# Patient Record
Sex: Female | Born: 1975 | Race: White | Hispanic: No | Marital: Single | Smoking: Never smoker
Health system: Southern US, Community
[De-identification: ages and names within clinical notes are randomized; demographics above are authoritative.]

## PROBLEM LIST (undated history)

## (undated) DIAGNOSIS — Z833 Family history of diabetes mellitus: Secondary | ICD-10-CM

## (undated) DIAGNOSIS — M419 Scoliosis, unspecified: Secondary | ICD-10-CM

## (undated) DIAGNOSIS — E119 Type 2 diabetes mellitus without complications: Secondary | ICD-10-CM

## (undated) HISTORY — PX: CERVICAL DISC SURGERY: SHX588

---

## 2002-02-01 ENCOUNTER — Encounter: Payer: Self-pay | Admitting: Neurosurgery

## 2002-02-01 ENCOUNTER — Ambulatory Visit (HOSPITAL_COMMUNITY): Admission: RE | Admit: 2002-02-01 | Discharge: 2002-02-02 | Payer: Self-pay | Admitting: Neurosurgery

## 2013-01-13 ENCOUNTER — Encounter (HOSPITAL_BASED_OUTPATIENT_CLINIC_OR_DEPARTMENT_OTHER): Payer: Self-pay

## 2013-01-13 ENCOUNTER — Emergency Department (HOSPITAL_BASED_OUTPATIENT_CLINIC_OR_DEPARTMENT_OTHER)
Admission: EM | Admit: 2013-01-13 | Discharge: 2013-01-13 | Disposition: A | Discharge status: Home / Self Care | Payer: BC Managed Care – PPO | Attending: Emergency Medicine | Admitting: Emergency Medicine

## 2013-01-13 ENCOUNTER — Emergency Department (HOSPITAL_BASED_OUTPATIENT_CLINIC_OR_DEPARTMENT_OTHER): Payer: BC Managed Care – PPO

## 2013-01-13 DIAGNOSIS — S335XXA Sprain of ligaments of lumbar spine, initial encounter: Secondary | ICD-10-CM | POA: Insufficient documentation

## 2013-01-13 DIAGNOSIS — Y9289 Other specified places as the place of occurrence of the external cause: Secondary | ICD-10-CM | POA: Insufficient documentation

## 2013-01-13 DIAGNOSIS — Y9389 Activity, other specified: Secondary | ICD-10-CM | POA: Insufficient documentation

## 2013-01-13 DIAGNOSIS — Z8739 Personal history of other diseases of the musculoskeletal system and connective tissue: Secondary | ICD-10-CM | POA: Insufficient documentation

## 2013-01-13 DIAGNOSIS — S39012A Strain of muscle, fascia and tendon of lower back, initial encounter: Secondary | ICD-10-CM

## 2013-01-13 DIAGNOSIS — R296 Repeated falls: Secondary | ICD-10-CM | POA: Insufficient documentation

## 2013-01-13 HISTORY — DX: Family history of diabetes mellitus: Z83.3

## 2013-01-13 HISTORY — DX: Scoliosis, unspecified: M41.9

## 2013-01-13 MED ORDER — KETOROLAC TROMETHAMINE 60 MG/2ML IM SOLN
60.0000 mg | Freq: Once | INTRAMUSCULAR | Status: AC
  Administered 2013-01-13: 60 mg via INTRAMUSCULAR
  Filled 2013-01-13: qty 2

## 2013-01-13 MED ORDER — DIAZEPAM 5 MG/ML IJ SOLN
5.0000 mg | Freq: Once | INTRAMUSCULAR | Status: AC
  Administered 2013-01-13: 5 mg via INTRAMUSCULAR
  Filled 2013-01-13: qty 2

## 2013-01-13 MED ORDER — KETOROLAC TROMETHAMINE 10 MG PO TABS: 10.0000 mg | ORAL_TABLET | Freq: Four times a day (QID) | ORAL | Status: DC | PRN

## 2013-01-13 MED ORDER — DIAZEPAM 5 MG PO TABS: 5.0000 mg | ORAL_TABLET | Freq: Once | ORAL | Status: DC

## 2013-01-13 MED ORDER — DIAZEPAM 5 MG PO TABS: 5.0000 mg | ORAL_TABLET | Freq: Three times a day (TID) | ORAL | Status: DC | PRN

## 2013-01-13 MED ORDER — IBUPROFEN 800 MG PO TABS: 800.0000 mg | ORAL_TABLET | Freq: Once | ORAL | Status: DC

## 2013-01-13 NOTE — ED Notes (Signed)
Patient transported to X-ray 

## 2013-01-13 NOTE — ED Notes (Signed)
Pt states CBG was 234 this am and she has a humalog insulin pump.

## 2013-01-13 NOTE — ED Notes (Signed)
Pt reports she was getting dressed in the locker room yesterday, felt her "legs buckle" and she fell.  She now reports low back pain unrelieved after taking Flexeril.

## 2013-01-13 NOTE — ED Notes (Signed)
EDP Bernette Mayers advised to given RTW 2/10 note

## 2013-01-13 NOTE — ED Provider Notes (Signed)
History     CSN: [id number]  Arrival date & time 01/13/13  0924   First MD Initiated Contact with Patient 01/13/13 1109      Chief Complaint  Patient presents with  . Back Pain    (Consider location/radiation/quality/duration/timing/severity/associated sxs/prior treatment) HPI Pt reports yesterday after work her legs gave out and she fell down. She reports she has had severe aching lower back pain since that time, worse with movement, not radiating down her leg. Not associated with incontinence or leg weakness. She has taken Flexeril at home with minimal improvement.   Past Medical History  Diagnosis Date  . Lumbar scoliosis   . Thoracic scoliosis   . FH: juvenile onset diabetes mellitus     Past Surgical History  Procedure Date  . Cervical disc surgery   . Cesarean section     No family history on file.  History  Substance Use Topics  . Smoking status: Never Smoker   . Smokeless tobacco: Not on file  . Alcohol Use: Yes     Comment: occasional    OB History    Grav Para Term Preterm Abortions TAB SAB Ect Mult Living                  Review of Systems All other systems reviewed and are negative except as noted in HPI.   Allergies  Sulfa antibiotics  Home Medications   Current Outpatient Rx  Name  Route  Sig  Dispense  Refill  . CYCLOBENZAPRINE HCL 5 MG PO TABS   Oral   Take 5 mg by mouth 3 (three) times daily as needed.           BP 140/88  Pulse 92  Temp 98.1 F (36.7 C) (Oral)  Resp 18  Ht 5\' 10"  (1.778 m)  Wt 175 lb (79.379 kg)  BMI 25.11 kg/m2  SpO2 100%  LMP 01/06/2013  Physical Exam  Nursing note and vitals reviewed. Constitutional: She is oriented to person, place, and time. She appears well-developed and well-nourished.  HENT:  Head: Normocephalic and atraumatic.  Eyes: EOM are normal. Pupils are equal, round, and reactive to light.  Neck: Normal range of motion. Neck supple.  Cardiovascular: Normal rate, normal heart sounds and  intact distal pulses.   Pulmonary/Chest: Effort normal and breath sounds normal.  Abdominal: Bowel sounds are normal. She exhibits no distension. There is no tenderness.  Musculoskeletal: Normal range of motion. She exhibits tenderness (soft tissue lumbar tenderness). She exhibits no edema.  Neurological: She is alert and oriented to person, place, and time. She has normal strength. She displays normal reflexes. No cranial nerve deficit or sensory deficit.  Skin: Skin is warm and dry. No rash noted.  Psychiatric: She has a normal mood and affect.    ED Course  Procedures (including critical care time)  Labs Reviewed - No data to display Dg Lumbar Spine Complete  01/13/2013  *RADIOLOGY REPORT*  Clinical Data: Low back pain with bilateral leg pain and weakness.  LUMBAR SPINE - COMPLETE 4+ VIEW  Comparison: None.  Findings: There is transitional lumbosacral anatomy.  It is assumed that there are small ribs at T12 and five lumbar type vertebral bodies.  There is a convex left scoliosis centered at L1.  The lateral alignment is normal.  There is mild disc space loss at L3- L4.  There is no evidence of acute fracture or pars defect. Intrauterine device is noted.  IMPRESSION: Scoliosis with possible mild disc space  loss at L3-L4.  No acute osseous findings.   Original Report Authenticated By: Carey Bullocks, M.D.      No diagnosis found.    MDM  Xray ordered in triage is neg. Pt given IM toradol and valium with good improvement. Doubt this is disc injury, but advised to follow up with PCP if symptoms persist.         Leonette Most B. Bernette Mayers, MD 01/13/13 [phone]

## 2014-10-04 IMAGING — CR DG LUMBAR SPINE COMPLETE 4+V
5 series · 5 of 5 positions shown · non-contrast
Comparison: None.

CLINICAL DATA: Low back pain with bilateral leg pain and weakness.

LUMBAR SPINE - COMPLETE 4+ VIEW

[t l-spine a.p.]
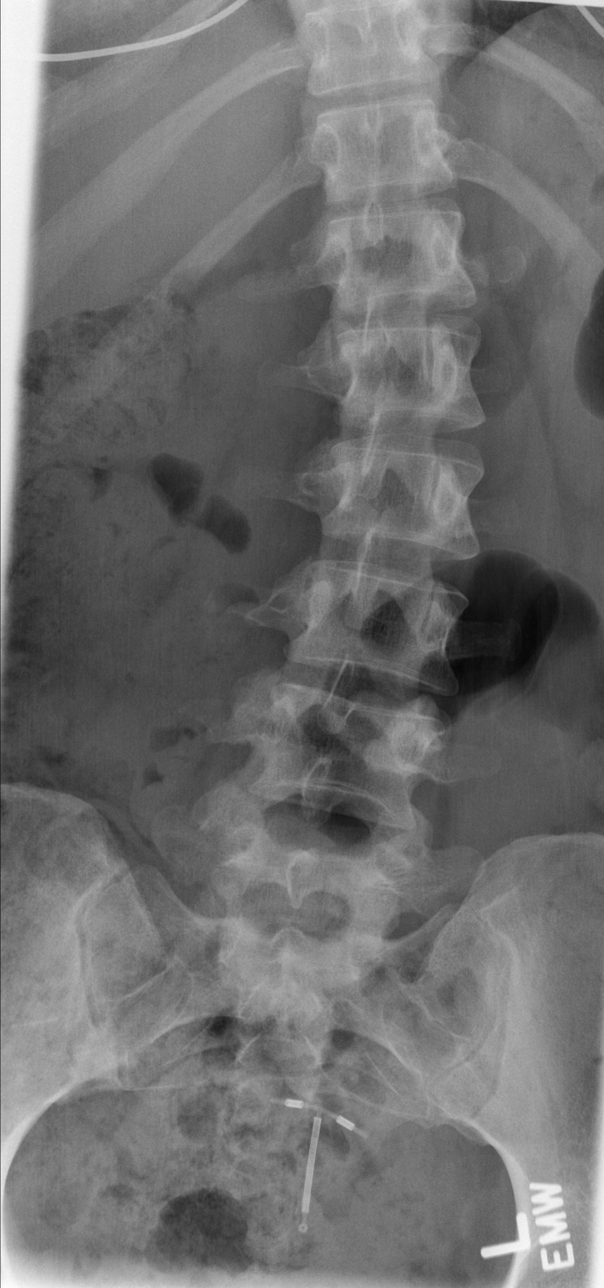

[t l-spine oblique exposure (1 of 2)]
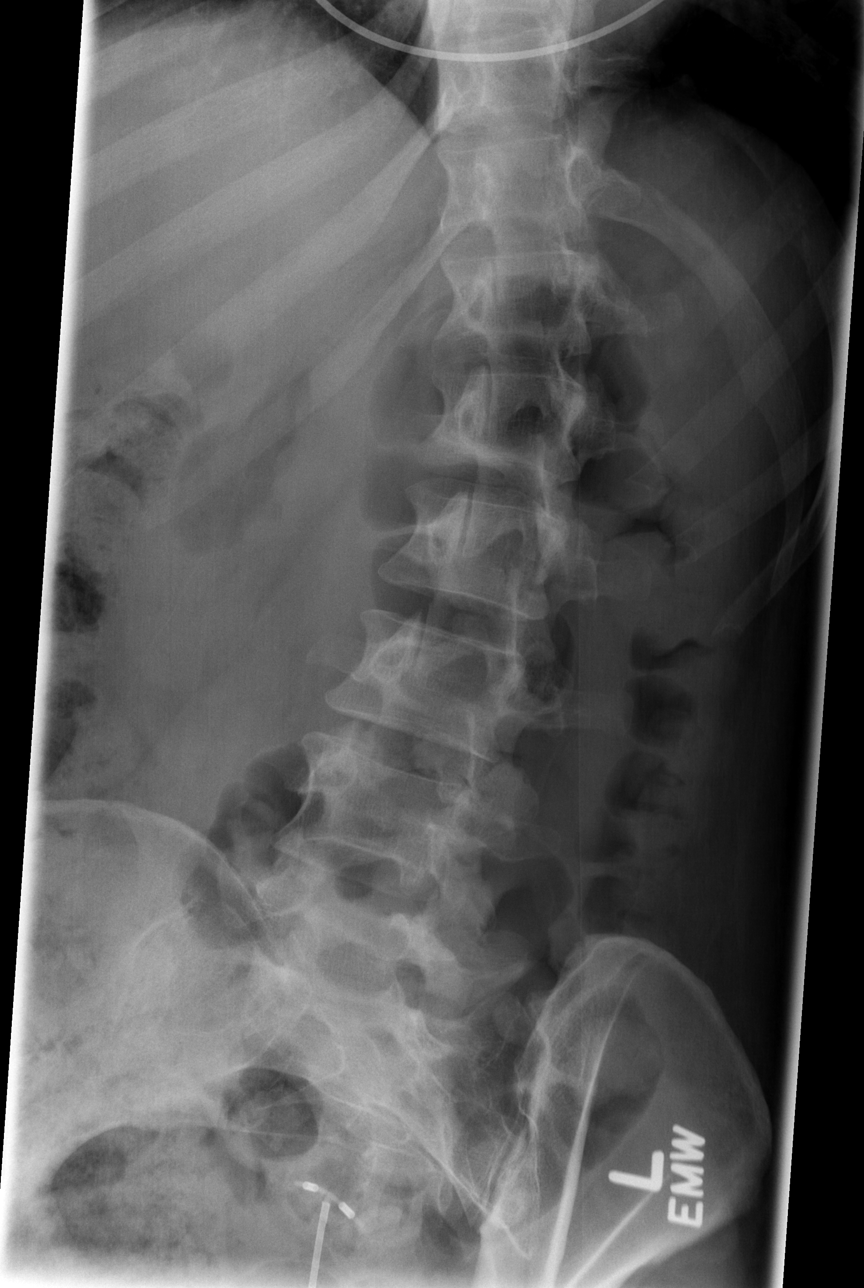

[t l-spine oblique exposure (2 of 2)]
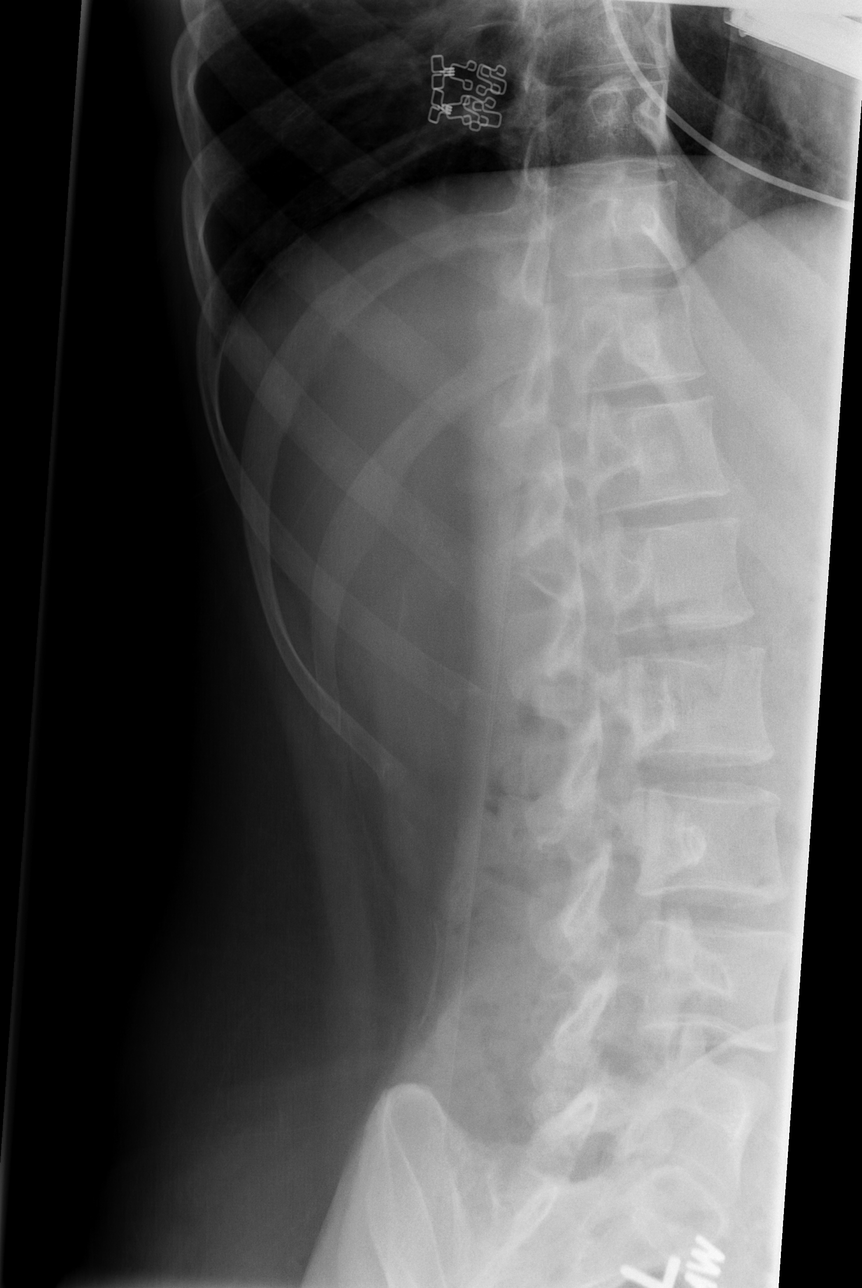

[t l-spine lat]
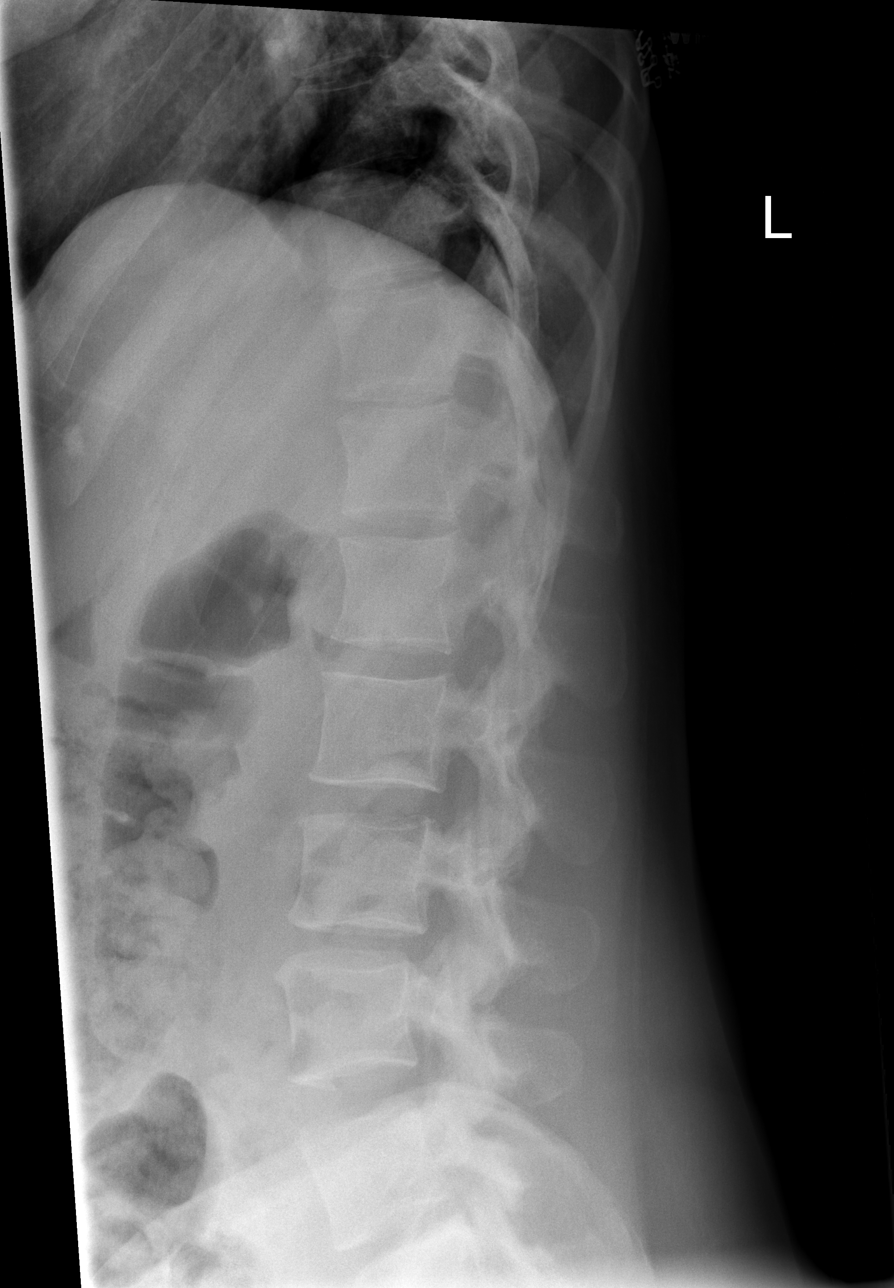

[t l-spine l5-s1 spot]
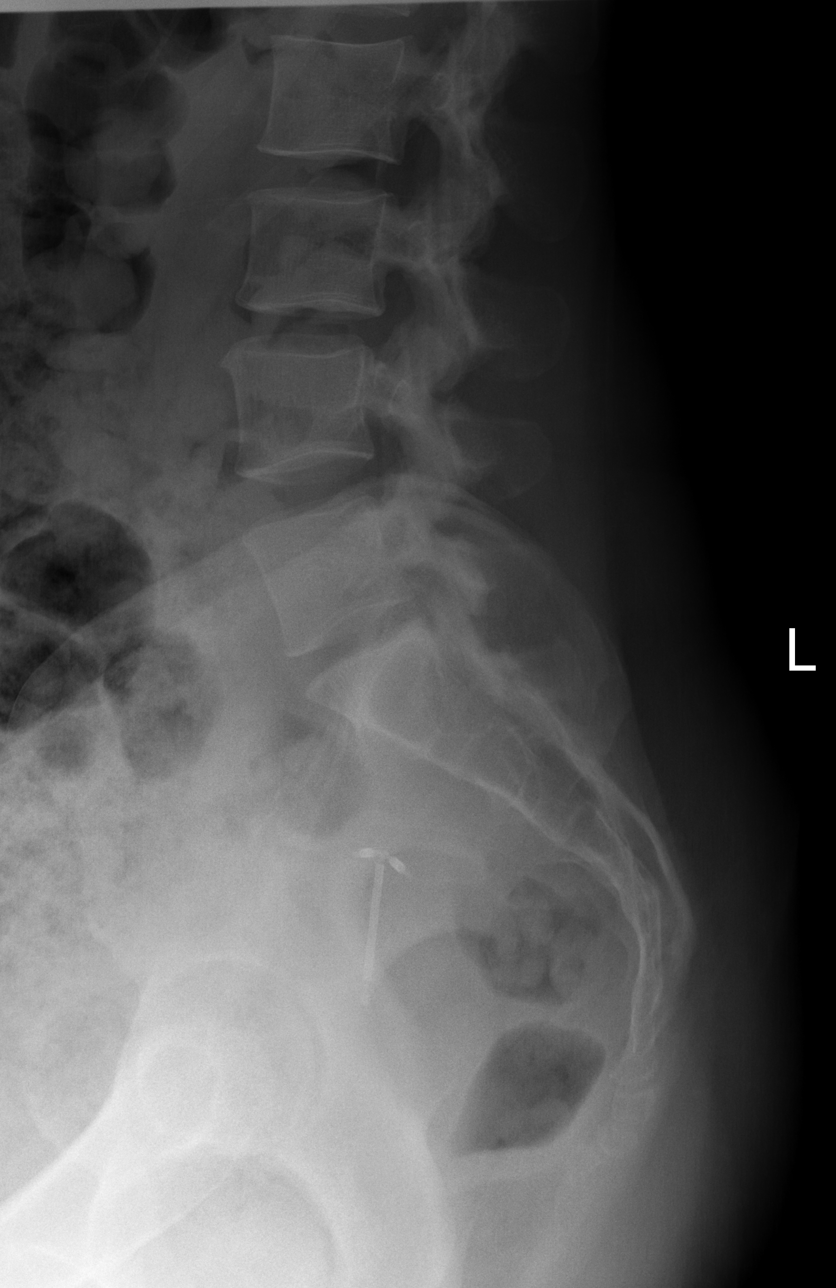

[5 of 5 positions shown; findings below may reference images not displayed]

FINDINGS: There is transitional lumbosacral anatomy.  It is assumed
that there are small ribs at T12 and five lumbar type vertebral
bodies.  There is a convex left scoliosis centered at L1.  The
lateral alignment is normal.  There is mild disc space loss at L3-
L4.  There is no evidence of acute fracture or pars defect.
Intrauterine device is noted.
IMPRESSION: Scoliosis with possible mild disc space loss at L3-L4.  No acute
osseous findings.

## 2017-01-26 ENCOUNTER — Emergency Department (HOSPITAL_COMMUNITY)
Admission: EM | Admit: 2017-01-26 | Discharge: 2017-01-26 | Disposition: A | Discharge status: Home / Self Care | Payer: BLUE CROSS/BLUE SHIELD | Attending: Emergency Medicine | Admitting: Emergency Medicine

## 2017-01-26 ENCOUNTER — Encounter (HOSPITAL_COMMUNITY): Payer: Self-pay | Admitting: Emergency Medicine

## 2017-01-26 DIAGNOSIS — R112 Nausea with vomiting, unspecified: Secondary | ICD-10-CM | POA: Diagnosis present

## 2017-01-26 DIAGNOSIS — R197 Diarrhea, unspecified: Secondary | ICD-10-CM | POA: Diagnosis not present

## 2017-01-26 DIAGNOSIS — E109 Type 1 diabetes mellitus without complications: Secondary | ICD-10-CM | POA: Insufficient documentation

## 2017-01-26 HISTORY — DX: Type 2 diabetes mellitus without complications: E11.9

## 2017-01-26 LAB — URINALYSIS, ROUTINE W REFLEX MICROSCOPIC
Bilirubin Urine: NEGATIVE
GLUCOSE, UA: NEGATIVE mg/dL
HGB URINE DIPSTICK: NEGATIVE
Ketones, ur: 80 mg/dL — AB
Leukocytes, UA: NEGATIVE
Nitrite: NEGATIVE
Protein, ur: NEGATIVE mg/dL
SPECIFIC GRAVITY, URINE: 1.017 (ref 1.005–1.030)
pH: 8 (ref 5.0–8.0)

## 2017-01-26 LAB — COMPREHENSIVE METABOLIC PANEL
ALK PHOS: 37 U/L — AB (ref 38–126)
ALT: 23 U/L (ref 14–54)
AST: 31 U/L (ref 15–41)
Albumin: 4 g/dL (ref 3.5–5.0)
Anion gap: 13 (ref 5–15)
BUN: 14 mg/dL (ref 6–20)
CALCIUM: 8.5 mg/dL — AB (ref 8.9–10.3)
CO2: 19 mmol/L — AB (ref 22–32)
CREATININE: 0.81 mg/dL (ref 0.44–1.00)
Chloride: 105 mmol/L (ref 101–111)
GFR calc non Af Amer: >60 mL/min (ref >60)
Glucose, Bld: 115 mg/dL — ABNORMAL HIGH (ref 65–99)
Potassium: 3.5 mmol/L (ref 3.5–5.1)
SODIUM: 137 mmol/L (ref 135–145)
Total Bilirubin: 0.9 mg/dL (ref 0.3–1.2)
Total Protein: 6.7 g/dL (ref 6.5–8.1)

## 2017-01-26 LAB — CBG MONITORING, ED: GLUCOSE-CAPILLARY: 103 mg/dL — AB (ref 65–99)

## 2017-01-26 LAB — CBC
HCT: 40.3 % (ref 36.0–46.0)
Hemoglobin: 13.9 g/dL (ref 12.0–15.0)
MCH: 30.7 pg (ref 26.0–34.0)
MCHC: 34.5 g/dL (ref 30.0–36.0)
MCV: 89 fL (ref 78.0–100.0)
PLATELETS: 199 10*3/uL (ref 150–400)
RBC: 4.53 MIL/uL (ref 3.87–5.11)
RDW: 13.5 % (ref 11.5–15.5)
WBC: 13.8 10*3/uL — ABNORMAL HIGH (ref 4.0–10.5)

## 2017-01-26 LAB — LIPASE, BLOOD: Lipase: 26 U/L (ref 11–51)

## 2017-01-26 LAB — I-STAT BETA HCG BLOOD, ED (MC, WL, AP ONLY): I-stat hCG, quantitative: <5 m[IU]/mL (ref <5)

## 2017-01-26 MED ORDER — METOCLOPRAMIDE HCL 10 MG PO TABS: 10.0000 mg | ORAL_TABLET | Freq: Four times a day (QID) | ORAL | 0 refills | Status: DC | PRN

## 2017-01-26 MED ORDER — PROMETHAZINE HCL 25 MG/ML IJ SOLN
12.5000 mg | Freq: Once | INTRAMUSCULAR | Status: AC
  Administered 2017-01-26: 12.5 mg via INTRAVENOUS
  Filled 2017-01-26: qty 1

## 2017-01-26 MED ORDER — METOCLOPRAMIDE HCL 5 MG/ML IJ SOLN
10.0000 mg | Freq: Once | INTRAMUSCULAR | Status: AC
  Administered 2017-01-26: 10 mg via INTRAVENOUS
  Filled 2017-01-26: qty 2

## 2017-01-26 MED ORDER — SODIUM CHLORIDE 0.9 % IV BOLUS (SEPSIS)
2000.0000 mL | Freq: Once | INTRAVENOUS | Status: AC
  Administered 2017-01-26: 2000 mL via INTRAVENOUS

## 2017-01-26 NOTE — Discharge Instructions (Signed)
You have been seen in the Emergency Department (ED) today for nausea and vomiting.  Your work up today has not shown a clear cause for your symptoms. °You have been prescribed Reglan; please use as prescribed as needed for your nausea. ° °Follow up with your doctor as soon as possible regarding today?s emergent visit and your symptoms of nausea.  ° °Return to the ED if you develop abdominal, bloody vomiting, bloody diarrhea, if you are unable to tolerate fluids due to vomiting, or if you develop other symptoms that concern you. ° °

## 2017-01-26 NOTE — ED Triage Notes (Signed)
BIB EMS from home, sudden onset N/V at 3p, pt also had syncope approx 45 min ago. Chills, fever, abd pain. Given 700NS, 8mg  zofran en route.

## 2017-01-26 NOTE — ED Provider Notes (Signed)
Emergency Department Provider Note   I have reviewed the triage vital signs and the nursing notes.   HISTORY  Chief Complaint Emesis and Loss of Consciousness   HPI Susan Watkins is a 41 y.o. female with PMH of Type I DM presents to the emergency department for evaluation of sudden onset nausea, multiple episodes of vomiting, one episode of diarrhea. Patient has had symptoms throughout the afternoon and is gradually getting worse. She called EMS and was given Zofran 8 mg in route with minimal relief and nausea. No prior symptoms of similar. She states her blood sugars have been well-controlled over the past week. No fevers, chills, chest pain or difficulty breathing. She states that she's been tested for gastroparesis and does not have this diagnosis.   Past Medical History:  Diagnosis Date  . Diabetes mellitus without complication (HCC)   . FH: juvenile onset diabetes mellitus   . Lumbar scoliosis   . Thoracic scoliosis     There are no active problems to display for this patient.   Past Surgical History:  Procedure Laterality Date  . CERVICAL DISC SURGERY    . CESAREAN SECTION      Current Outpatient Rx  . Order #: [id number] Class: Historical Med  . Order #: [id number] Class: Print  . Order #: [id number] Class: Print  . Order #: [id number] Class: Print    Allergies Sulfa antibiotics  No family history on file.  Social History Social History  Substance Use Topics  . Smoking status: Never Smoker  . Smokeless tobacco: Not on file  . Alcohol use Yes     Comment: occasional    Review of Systems  10-point ROS otherwise negative.  ____________________________________________   PHYSICAL EXAM:  VITAL SIGNS: ED Triage Vitals  Enc Vitals Group     BP 01/26/17 1931 119/68     Pulse Rate 01/26/17 1931 99     Resp 01/26/17 1931 (!) 34     Temp 01/26/17 1931 99.3 F (37.4 C)     Temp Source 01/26/17 1931 Oral     SpO2 01/26/17 1925 97 %     Weight 01/26/17 1928 166  lb (75.3 kg)     Height 01/26/17 1928 5\' 10"  (1.778 m)     Pain Score 01/26/17 1928 5   Constitutional: Alert and oriented. Appears very uncomfortable with frequent dry heaving.  Eyes: Conjunctivae are normal.  Head: Atraumatic. Nose: No congestion/rhinnorhea. Mouth/Throat: Mucous membranes are dry. Oropharynx non-erythematous. Neck: No stridor.  Cardiovascular: Normal rate, regular rhythm. Good peripheral circulation. Grossly normal heart sounds.   Respiratory: Normal respiratory effort.  No retractions. Lungs CTAB. Gastrointestinal: Soft and nontender. No distention.  Musculoskeletal: No lower extremity tenderness nor edema. No gross deformities of extremities. Neurologic:  Normal speech and language. No gross focal neurologic deficits are appreciated.  Skin:  Skin is warm, dry and intact. No rash noted. Psychiatric: Mood and affect are normal. Speech and behavior are normal.  ____________________________________________   LABS (all labs ordered are listed, but only abnormal results are displayed)  Labs Reviewed  CBC - Abnormal; Notable for the following:       Result Value   WBC 13.8 (*)    All other components within normal limits  URINALYSIS, ROUTINE W REFLEX MICROSCOPIC - Abnormal; Notable for the following:    Ketones, ur 80 (*)    All other components within normal limits  COMPREHENSIVE METABOLIC PANEL - Abnormal; Notable for the following:    CO2 19 (*)    Glucose,  Bld 115 (*)    Calcium 8.5 (*)    Alkaline Phosphatase 37 (*)    All other components within normal limits  CBG MONITORING, ED - Abnormal; Notable for the following:    Glucose-Capillary 103 (*)    All other components within normal limits  LIPASE, BLOOD  I-STAT BETA HCG BLOOD, ED ([facility], [facility], [facility] ONLY)    EKG:   EKG Interpretation  Date/Time:  Sunday January 26 2017 20:35:29 EST Ventricular Rate:  80 PR Interval:    QRS Duration: 97 QT Interval:  411 QTC Calculation: 475 R Axis:   82 Text  Interpretation:  Sinus rhythm No STEMI.  Confirmed by ZAMMIT  MD, Jomarie LongsJOSEPH [phone]([id number]) on 01/27/2017 3:25:58 PM       ____________________________________________  RADIOLOGY  None ____________________________________________   PROCEDURES  Procedure(s) performed:   Procedures  None ____________________________________________   INITIAL IMPRESSION / ASSESSMENT AND PLAN / ED COURSE  Pertinent labs & imaging results that were available during my care of the patient were reviewed by me and considered in my medical decision making (see chart for details).  Patient with severe nausea and vomiting. Not complaining of significant associated abdominal pain. Abdomen is soft and non-tender. Will obtain labs to r/o DKA and provide IVF along with nausea control.   08:40 PM Nausea improved but patient reporting some continued discomfort. Repeat EKG with QTc of 475. Will give Reglan. Leukocytosis with CMP pending.   09:52 PM Patient with much improved symptoms. Giving additional IVF and PO challenge. No abdominal pain. No residual nausea.   10:45 PM Patient tolerating PO. Continues to feel well. Will discharge home at this time.   At this time, I do not feel there is any life-threatening condition present. I have reviewed and discussed all results (EKG, imaging, lab, urine as appropriate), exam findings with patient. I have reviewed nursing notes and appropriate previous records.  I feel the patient is safe to be discharged home without further emergent workup. Discussed usual and customary return precautions. Patient and family (if present) verbalize understanding and are comfortable with this plan.  Patient will follow-up with their primary care provider. If they do not have a primary care provider, information for follow-up has been provided to them. All questions have been answered.  ____________________________________________  FINAL CLINICAL IMPRESSION(S) / ED DIAGNOSES  Final diagnoses:    Nausea vomiting and diarrhea     MEDICATIONS GIVEN DURING THIS VISIT:  Medications  promethazine (PHENERGAN) injection 12.5 mg (12.5 mg Intravenous Given 01/26/17 1949)  metoCLOPramide (REGLAN) injection 10 mg (10 mg Intravenous Given 01/26/17 2048)  sodium chloride 0.9 % bolus 2,000 mL (0 mLs Intravenous Stopped 01/26/17 2320)     NEW OUTPATIENT MEDICATIONS STARTED DURING THIS VISIT:  Discharge Medication List as of 01/26/2017 10:55 PM    START taking these medications   Details  metoCLOPramide (REGLAN) 10 MG tablet Take 1 tablet (10 mg total) by mouth every 6 (six) hours as needed for nausea or vomiting., Starting Sun 01/26/2017, Print          Note:  This document was prepared using Dragon voice recognition software and may include unintentional dictation errors.  Alona BeneJoshua Adama Ferber, MD Emergency Medicine   Maia PlanJoshua G Neyda Durango, MD 01/28/17 [phone]

## 2017-01-26 NOTE — ED Notes (Signed)
Pt passed PO challenge.

## 2017-01-26 NOTE — ED Notes (Signed)
Repeat EKG obtained and given to Long MD. Pt was ambulatory to restroom with steady gait.

## 2018-08-21 ENCOUNTER — Ambulatory Visit: Payer: BLUE CROSS/BLUE SHIELD | Admitting: Endocrinology

## 2018-08-21 ENCOUNTER — Encounter: Payer: Self-pay | Admitting: Endocrinology

## 2018-08-21 DIAGNOSIS — E119 Type 2 diabetes mellitus without complications: Secondary | ICD-10-CM | POA: Insufficient documentation

## 2018-08-21 DIAGNOSIS — E109 Type 1 diabetes mellitus without complications: Secondary | ICD-10-CM

## 2018-08-21 LAB — POCT GLYCOSYLATED HEMOGLOBIN (HGB A1C): Hemoglobin A1C: 6.7 % — AB (ref 4.0–5.6)

## 2018-08-21 MED ORDER — PARADIGM PUMP RESERVOIR 3ML MISC: 1.0000 | 3 refills | Status: DC

## 2018-08-21 MED ORDER — "PARADIGM SILHOUETTE FULL 43"" MISC": 1.0000 | 3 refills | Status: DC

## 2018-08-21 NOTE — Patient Instructions (Addendum)
good diet and exercise significantly improve the control of your diabetes.  please let me know if you wish to be referred to a dietician.  high blood sugar is very risky to your health.  you should see an eye doctor and dentist every year.  It is very important to get all recommended vaccinations.  Controlling your blood pressure and cholesterol drastically reduces the damage diabetes does to your body.  Those who smoke should quit.  Please discuss these with your doctor.  check your blood sugar 10 times a day: before the 3 meals, and at bedtime.  also check if you have symptoms of your blood sugar being too high or too low.  please keep a record of the readings and bring it to your next appointment here (or you can bring the meter itself).  You can write it on any piece of paper.  please call us sooner if your blood sugar goes below 70, or if you have a lot of readings over 200. Please take these settings: basal rates: MN 0.95 units/hr 04:30 0.9 11:00 0.85 13:00 0.875 13:30 0.825 16:30 0.85 17:00 1.2 bolus of 1 unit/14 grams carbohydrate continue correction bolus (which some people call "sensitivity," or "insulin sensitivity ratio," or just "isr") of 1 unit for each 80 by which your glucose exceeds 90. Please come back for a follow-up appointment in 3 months.

## 2018-08-21 NOTE — Progress Notes (Signed)
Subjective:    Patient ID: Susan Watkins, female    DOB: 10-03-1976, 42 y.o.   MRN: [id number]  HPI pt is referred by Bradly Bienenstock, NP, for diabetes.  Pt states DM was dx'ed in 1987; she has mild if any neuropathy of the lower extremities; she is unaware of any associated chronic complications; she has been on insulin since dx; pt says her diet and exercise are excellent; she has never had pancreatitis or pancreatic surgery.  Last episode of severe hypoglycemia was in 2009.  She has had DKA only once (at dx).  She has been on pump rx since 1996, and medtronic 670G, with continuous glucose monitor since 2018.  She is not at risk for pregnancy.  She does not use auto-mode--she says manual adjustments work better.  I reviewed continuous glucose monitor data.  Glucose varies from 40-280, but most are in the 100's.  It is in general higher after meals, and lowest fasting. She takes these pump settings: Basal settings (~ 22.488 units) 0:00 1.00 2:00 0.85 4:30 1.10 13:30 0.725 16:30 0.825 20:00 0.850 22:00 0.875 Carb ratio 0:00 8 10:00 10 15:00 8 ISF  0:00 55 Target 100 Past Medical History:  Diagnosis Date  . Diabetes mellitus without complication ([place])   . FH: juvenile onset diabetes mellitus   . Lumbar scoliosis   . Thoracic scoliosis     Past Surgical History:  Procedure Laterality Date  . CERVICAL DISC SURGERY    . CESAREAN SECTION      Social History   Socioeconomic History  . Marital status: Single    Spouse name: Not on file  . Number of children: Not on file  . Years of education: Not on file  . Highest education level: Not on file  Occupational History  . Not on file  Social Needs  . Financial resource strain: Not on file  . Food insecurity:    Worry: Not on file    Inability: Not on file  . Transportation needs:    Medical: Not on file    Non-medical: Not on file  Tobacco Use  . Smoking status: Never Smoker  . Smokeless tobacco: Never Used  Substance  and Sexual Activity  . Alcohol use: Yes    Comment: occasional  . Drug use: No  . Sexual activity: Not on file  Lifestyle  . Physical activity:    Days per week: Not on file    Minutes per session: Not on file  . Stress: Not on file  Relationships  . Social connections:    Talks on phone: Not on file    Gets together: Not on file    Attends religious service: Not on file    Active member of club or organization: Not on file    Attends meetings of clubs or organizations: Not on file    Relationship status: Not on file  . Intimate partner violence:    Fear of current or ex partner: Not on file    Emotionally abused: Not on file    Physically abused: Not on file    Forced sexual activity: Not on file  Other Topics Concern  . Not on file  Social History Narrative  . Not on file    Current Outpatient Medications on File Prior to Visit  Medication Sig Dispense Refill  . furosemide (LASIX) 20 MG tablet furosemide 20 mg tablet    . glucagon 1 MG injection Glucagon Emergency Kit (human-recomb) 1 mg solution for injection    .  Insulin Glargine (LANTUS SOLOSTAR) 100 UNIT/ML Solostar Pen Lantus Solostar U-100 Insulin 100 unit/mL (3 mL) subcutaneous pen    . insulin lispro (HUMALOG) 100 UNIT/ML injection Humalog U-100 Insulin 100 unit/mL subcutaneous solution    . lisinopril (PRINIVIL,ZESTRIL) 10 MG tablet lisinopril 10 mg tablet    . methocarbamol (ROBAXIN) 500 MG tablet Take by mouth.     No current facility-administered medications on file prior to visit.     Allergies  Allergen Reactions  . Levofloxacin Other (See Comments)    Severe Tendonitis    . Sulfa Antibiotics Rash    Family History  Problem Relation Age of Onset  . Diabetes Neg Hx     BP 116/78 (BP Location: Right Arm)   Pulse 67   Ht _0  (1.778 m)   Wt 184 lb 3.2 oz (83.6 kg)   SpO2 98%   BMI 26.43 kg/m   Review of Systems denies weight loss, blurry vision, headache, chest pain, sob, n/v, urinary  frequency, muscle cramps, excessive diaphoresis, depression, rhinorrhea, and easy bruising.  She has cold intolerance.      Objective:   Physical Exam VS: see vs page GEN: no distress HEAD: head: no deformity eyes: no periorbital swelling, no proptosis external nose and ears are normal mouth: no lesion seen NECK: supple, thyroid is not enlarged CHEST WALL: no deformity LUNGS: clear to auscultation CV: reg rate and rhythm, no murmur ABD: abdomen is soft, nontender.  no hepatosplenomegaly.  not distended.  no hernia MUSCULOSKELETAL: muscle bulk and strength are grossly normal.  no obvious joint swelling.  gait is normal and steady EXTEMITIES: no deformity.  no ulcer on the feet.  feet are of normal color and temp.  no edema PULSES: dorsalis pedis intact bilat.  no carotid bruit NEURO:  cn 2-12 grossly intact.   readily moves all 4's.  sensation is intact to touch on the feet SKIN:  Normal texture and temperature.  No rash or suspicious lesion is visible.   NODES:  None palpable at the neck PSYCH: alert, well-oriented.  Does not appear anxious nor depressed.  Lab Results  Component Value Date   HGBA1C 6.7 (A) 08/21/2018   I have reviewed outside records, and summarized: Pt was noted to have elevated DM, and referred here.  Other prob addressed were HTN and edema.      Assessment & Plan:  Type 1 DM, new to me.  Based on the pattern of her cbg's, she needs some adjustment in her pump settings.  Patient Instructions  good diet and exercise significantly improve the control of your diabetes.  please let me know if you wish to be referred to a dietician.  high blood sugar is very risky to your health.  you should see an eye doctor and dentist every year.  It is very important to get all recommended vaccinations.  Controlling your blood pressure and cholesterol drastically reduces the damage diabetes does to your body.  Those who smoke should quit.  Please discuss these with your doctor.    check your blood sugar 10 times a day: before the 3 meals, and at bedtime.  also check if you have symptoms of your blood sugar being too high or too low.  please keep a record of the readings and bring it to your next appointment here (or you can bring the meter itself).  You can write it on any piece of paper.  please call us sooner if your blood sugar goes below 70,  or if you have a lot of readings over 200. Please take these settings: basal rates: MN 0.95 units/hr 04:30 0.9 11:00 0.85 13:00 0.875 13:30 0.825 16:30 0.85 17:00 1.2 bolus of 1 unit/14 grams carbohydrate continue correction bolus (which some people call "sensitivity," or "insulin sensitivity ratio," or just "isr") of 1 unit for each 80 by which your glucose exceeds 90. Please come back for a follow-up appointment in 3 months.

## 2018-09-03 ENCOUNTER — Telehealth: Payer: Self-pay | Admitting: Dietician

## 2018-09-03 NOTE — Telephone Encounter (Signed)
Brief Nutrition Note Patient called to set up an appointment for weight loss.  History includes diabetes. Appointment arranged for October 28th at 3:30.  Oran Rein, RD, LDN, CDE

## 2018-10-01 ENCOUNTER — Telehealth: Payer: Self-pay | Admitting: Endocrinology

## 2018-10-01 ENCOUNTER — Other Ambulatory Visit: Payer: Self-pay

## 2018-10-01 MED ORDER — INSULIN LISPRO 100 UNIT/ML ~~LOC~~ SOLN: 45.0000 [IU] | Freq: Every day | SUBCUTANEOUS | 3 refills | Status: DC

## 2018-10-01 NOTE — Telephone Encounter (Signed)
Refill request for Humalog sent to pt pharmacy of choice.

## 2018-10-01 NOTE — Telephone Encounter (Signed)
Called pt for clarification of request. According to Dr. George Hugh note from 08/21/18, pt needed pump setting adjusted. From his note, this indicates pt already had a pump. On the same day, pump supplies were sent electronically to [facility]. Is she referring to supplies rather than her pump? LVM requesting returned call.

## 2018-10-01 NOTE — Telephone Encounter (Signed)
Patient called the [facility] about her insulin pump. She stated our office was suppose to call in the pump for her and she has not received anything or heard anything else about this being done  Please advise

## 2018-10-02 ENCOUNTER — Telehealth: Payer: Self-pay | Admitting: Endocrinology

## 2018-10-02 MED ORDER — INSULIN LISPRO 100 UNIT/ML ~~LOC~~ SOLN: SUBCUTANEOUS | 3 refills | Status: DC

## 2018-10-02 NOTE — Telephone Encounter (Signed)
Pt stated she use 45 units a day and only 1 valve was called in for her (HUMALOG. Pt stated she usually get 3-4 valves every 3 months. Please advise pt.

## 2018-10-02 NOTE — Telephone Encounter (Signed)
Please advise if you agree with pt request for 3-4 vials rather than the Rx that had been sent on 08/21/18 for 1 vial.

## 2018-10-02 NOTE — Telephone Encounter (Signed)
Ok, I have sent 

## 2018-10-05 ENCOUNTER — Ambulatory Visit: Payer: BLUE CROSS/BLUE SHIELD | Admitting: Dietician

## 2018-11-27 ENCOUNTER — Ambulatory Visit: Payer: BLUE CROSS/BLUE SHIELD | Admitting: Endocrinology

## 2018-11-27 ENCOUNTER — Encounter: Payer: Self-pay | Admitting: Endocrinology

## 2018-11-27 VITALS — BP 140/100 | HR 92 | Temp 98.3°F | Ht 70.0 in | Wt 188.2 lb

## 2018-11-27 DIAGNOSIS — E109 Type 1 diabetes mellitus without complications: Secondary | ICD-10-CM

## 2018-11-27 LAB — POCT GLYCOSYLATED HEMOGLOBIN (HGB A1C): HEMOGLOBIN A1C: 6.9 % — AB (ref 4.0–5.6)

## 2018-11-27 NOTE — Progress Notes (Signed)
Subjective:    Patient ID: Susan Watkins, female    DOB: 09-25-1976, 42 y.o.   MRN: [id number]  HPI Pt returns for f/u of diabetes mellitus: DM type: 1 Dx'ed: 6270 Complications: none Therapy: insulin since dx DKA: only once (at dx) Severe hypoglycemia: last episode was in 2009 Pancreatitis: never Pancreatic imaging: never Other: she stopped using continuous glucose monitor, due to severe hyperglycemia; he has been on pump rx since 1996, and medtronic 670G, since 2018  Interval history: She takes these pump settings: basal rates: MN 0.95 units/hr 04:30 0.9 11:00 0.85 13:00 0.875 13:30 0.825 16:30 0.85 17:00 1.2 bolus of 1 unit/14 grams carbohydrate continue correction bolus (which some people call "sensitivity," or "insulin sensitivity ratio," or just "isr") of 1 unit for each 80 by which glucose exceeds 90. Meter is downloaded today, and the printout is scanned into the record.  cbg varies from 42-270.  There is no trend throughout the day. Past Medical History:  Diagnosis Date  . Diabetes mellitus without complication ([place])   . FH: juvenile onset diabetes mellitus   . Lumbar scoliosis   . Thoracic scoliosis     Past Surgical History:  Procedure Laterality Date  . CERVICAL DISC SURGERY    . CESAREAN SECTION      Social History   Socioeconomic History  . Marital status: Single    Spouse name: Not on file  . Number of children: Not on file  . Years of education: Not on file  . Highest education level: Not on file  Occupational History  . Not on file  Social Needs  . Financial resource strain: Not on file  . Food insecurity:    Worry: Not on file    Inability: Not on file  . Transportation needs:    Medical: Not on file    Non-medical: Not on file  Tobacco Use  . Smoking status: Never Smoker  . Smokeless tobacco: Never Used  Substance and Sexual Activity  . Alcohol use: Yes    Comment: occasional  . Drug use: No  . Sexual activity: Not on file    Lifestyle  . Physical activity:    Days per week: Not on file    Minutes per session: Not on file  . Stress: Not on file  Relationships  . Social connections:    Talks on phone: Not on file    Gets together: Not on file    Attends religious service: Not on file    Active member of club or organization: Not on file    Attends meetings of clubs or organizations: Not on file    Relationship status: Not on file  . Intimate partner violence:    Fear of current or ex partner: Not on file    Emotionally abused: Not on file    Physically abused: Not on file    Forced sexual activity: Not on file  Other Topics Concern  . Not on file  Social History Narrative  . Not on file    Current Outpatient Medications on File Prior to Visit  Medication Sig Dispense Refill  . furosemide (LASIX) 20 MG tablet furosemide 20 mg tablet    . glucagon 1 MG injection Glucagon Emergency Kit (human-recomb) 1 mg solution for injection    . Insulin Infusion Pump Supplies (PARADIGM RESERVOIR 3ML) MISC 1 Device by Does not apply route every 3 (three) days. 10 each 3  . Insulin Infusion Pump Supplies (PARADIGM SILHOUETTE FULL 43") MISC 1  Device by Does not apply route every 3 (three) days. 10 each 3  . insulin lispro (HUMALOG) 100 UNIT/ML injection For use in pump, total of 50 units per day 50 mL 3  . lisinopril (PRINIVIL,ZESTRIL) 10 MG tablet lisinopril 10 mg tablet    . methocarbamol (ROBAXIN) 500 MG tablet Take by mouth.     No current facility-administered medications on file prior to visit.     Allergies  Allergen Reactions  . Levofloxacin Other (See Comments)    Severe Tendonitis    . Sulfa Antibiotics Rash    Family History  Problem Relation Age of Onset  . Diabetes Neg Hx     BP (!) 140/100 (BP Location: Left Arm, Patient Position: Sitting, Cuff Size: Normal)   Pulse 92   Temp 98.3 F (36.8 C) (Oral)   Ht _0  (1.778 m)   Wt 188 lb 3.2 oz (85.4 kg)   LMP 11/20/2018 (Approximate)   BMI  27.00 kg/m    Review of Systems Denies LOC    Objective:   Physical Exam VITAL SIGNS:  See vs page GENERAL: no distress Pulses: dorsalis pedis intact bilat.   MSK: no deformity of the feet CV: no leg edema Skin:  no ulcer on the feet.  normal color and temp on the feet. Neuro: sensation is intact to touch on the feet  Lab Results  Component Value Date   HGBA1C 6.9 (A) 11/27/2018        Assessment & Plan:  Type 1 DM: good overall glycemia control Hypoglycemia: I advised resumption of continuous glucose monitor.   HTN: recheck next time.    Patient Instructions  Please re-try the continuous glucose monitor.  Please set the alarm at 90-100, so it goes off before your blood sigar goes low.   Other than that, Please continue the same pump settings:  MN 0.95 units/hr 0200: 1.1 0430: 1.2 0830: 1.1 1100: 0.975 1300 0.95 1630 0.925 1700 1.3 bolus of 1 unit/8 grams carbohydrate continue correction bolus (which some people call "sensitivity," or "insulin sensitivity ratio," or just "isr") of 1 unit for each 80 by which your glucose exceeds 90. [name] or [name] would be happy to help you with the continuous glucose monitor, also.  Please come back for a follow-up appointment in 3 months.

## 2018-11-27 NOTE — Patient Instructions (Addendum)
Please re-try the continuous glucose monitor.  Please set the alarm at 90-100, so it goes off before your blood sigar goes low.   Other than that, Please continue the same pump settings:  MN 0.95 units/hr 0200: 1.1 0430: 1.2 0830: 1.1 1100: 0.975 1300 0.95 1630 0.925 1700 1.3 bolus of 1 unit/8 grams carbohydrate continue correction bolus (which some people call "sensitivity," or "insulin sensitivity ratio," or just "isr") of 1 unit for each 80 by which your glucose exceeds 90. Vernona RiegerLaura or Bonita QuinLinda would be happy to help you with the continuous glucose monitor, also.  Please come back for a follow-up appointment in 3 months.

## 2018-11-30 ENCOUNTER — Telehealth: Payer: Self-pay | Admitting: Nutrition

## 2018-11-30 NOTE — Telephone Encounter (Signed)
Message left on machine to call me to see if we can figure out what is making the reading high, and if they are similar to the blood sugar readings.  Gave times for tomorrow to see me to help with this problem

## 2019-01-07 ENCOUNTER — Ambulatory Visit (INDEPENDENT_AMBULATORY_CARE_PROVIDER_SITE_OTHER): Payer: Self-pay

## 2019-01-07 ENCOUNTER — Encounter (INDEPENDENT_AMBULATORY_CARE_PROVIDER_SITE_OTHER): Payer: Self-pay | Admitting: Orthopaedic Surgery

## 2019-01-07 ENCOUNTER — Ambulatory Visit (INDEPENDENT_AMBULATORY_CARE_PROVIDER_SITE_OTHER): Payer: BLUE CROSS/BLUE SHIELD | Admitting: Orthopaedic Surgery

## 2019-01-07 DIAGNOSIS — M7061 Trochanteric bursitis, right hip: Secondary | ICD-10-CM | POA: Diagnosis not present

## 2019-01-07 DIAGNOSIS — M25551 Pain in right hip: Secondary | ICD-10-CM | POA: Diagnosis not present

## 2019-01-07 DIAGNOSIS — M25851 Other specified joint disorders, right hip: Secondary | ICD-10-CM

## 2019-01-07 MED ORDER — LIDOCAINE HCL 1 % IJ SOLN
3.0000 mL | INTRAMUSCULAR | Status: AC | PRN
  Administered 2019-01-07: 3 mL

## 2019-01-07 MED ORDER — BUPIVACAINE HCL 0.5 % IJ SOLN
3.0000 mL | INTRAMUSCULAR | Status: AC | PRN
  Administered 2019-01-07: 3 mL via INTRA_ARTICULAR

## 2019-01-07 MED ORDER — METHYLPREDNISOLONE ACETATE 40 MG/ML IJ SUSP
40.0000 mg | INTRAMUSCULAR | Status: AC | PRN
  Administered 2019-01-07: 40 mg via INTRA_ARTICULAR

## 2019-01-07 MED ORDER — TRIAMCINOLONE ACETONIDE 40 MG/ML IJ SUSP
80.0000 mg | INTRAMUSCULAR | Status: AC | PRN
  Administered 2019-01-07: 80 mg via INTRA_ARTICULAR

## 2019-01-07 NOTE — Addendum Note (Signed)
Addended by: Ashok NorrisNEWTON, Kadee Philyaw K on: 01/07/2019 03:48 PM   Modules accepted: Orders

## 2019-01-07 NOTE — Progress Notes (Signed)
   Susan Watkins - 43 y.o. female MRN [id number]  Date of birth: July 23, 1976  Office Visit Note: Visit Date: 01/07/2019 PCP: April Manson, NP Referred by: April Manson, NP  Subjective: Chief Complaint  Patient presents with  . Right Hip - Pain   HPI:  Susan Watkins is a 43 y.o. female who comes in today At request of Dr. Doneen Poisson for diagnostic and therapeutic right hip anesthetic hip arthrogram.  ROS Otherwise per HPI.  Assessment & Plan: Visit Diagnoses:  1. Trochanteric bursitis, right hip   2. Pain of right hip joint   3. Femoroacetabular impingement of right hip     Plan: No additional findings.   Meds & Orders:  Meds ordered this encounter  Medications  . lidocaine (XYLOCAINE) 1 % (with pres) injection 3 mL  . methylPREDNISolone acetate (DEPO-MEDROL) injection 40 mg    Orders Placed This Encounter  Procedures  . Large Joint Inj: R greater trochanter  . Large Joint Inj  . XR C-ARM NO REPORT    Follow-up: Return in about 4 weeks (around 02/04/2019).   Procedures: Large Joint Inj: R hip joint on 01/07/2019 3:32 PM Indications: pain and diagnostic evaluation Details: 22 G needle, anterior approach  Arthrogram: Yes  Medications: 80 mg triamcinolone acetonide 40 MG/ML; 3 mL bupivacaine 0.5 % Outcome: tolerated well, no immediate complications  Arthrogram demonstrated excellent flow of contrast throughout the joint surface without extravasation or obvious defect.  The patient had relief of symptoms during the anesthetic phase of the injection.  Procedure, treatment alternatives, risks and benefits explained, specific risks discussed. Consent was given by the patient. Immediately prior to procedure a time out was called to verify the correct patient, procedure, equipment, support staff and site/side marked as required. Patient was prepped and draped in the usual sterile fashion.      No notes on file   Clinical History: No specialty comments  available.     Objective:  VS:  HT:    WT:   BMI:     BP:   HR: bpm  TEMP: ( )  RESP:  Physical Exam  Ortho Exam Imaging: No results found.

## 2019-01-07 NOTE — Progress Notes (Signed)
Office Visit Note   Patient: Susan Watkins           Date of Birth: July 10, 1976           MRN: [id number] Visit Date: 01/07/2019              Requested by: April Manson, NP [address], [place] [address] PCP: April Manson, NP   Assessment & Plan: Visit Diagnoses:  1. Trochanteric bursitis, right hip   2. Pain of right hip joint   3. Femoroacetabular impingement of right hip     Plan: I do feel that she has a component of trochanteric bursitis as well as a component of femoral acetabular impingement.  Her MRI does show edema in the gluteus medius and minimus tendon around her right hip and this is where she is very tender.  I did provide a steroid injection in the trochanteric area and she got good relief from this.  This not relieve her groin pain.  I would like to set her up for an intra-articular steroid injection under fluoroscopy by Dr. Alvester Morin as a consultation for her right hip.  Also showed her stretching exercises for trochanteric bursitis that I want her to do twice a day.  All question concerns were answered and addressed.  I like to see her back in 4 weeks.  Follow-Up Instructions: Return in about 4 weeks (around 02/04/2019).   Orders:  No orders of the defined types were placed in this encounter.  No orders of the defined types were placed in this encounter.     Procedures: Large Joint Inj: R greater trochanter on 01/07/2019 2:01 PM Indications: pain and diagnostic evaluation Details: 22 G 1.5 in needle, lateral approach  Arthrogram: No  Medications: 3 mL lidocaine 1 %; 40 mg methylPREDNISolone acetate 40 MG/ML Outcome: tolerated well, no immediate complications Procedure, treatment alternatives, risks and benefits explained, specific risks discussed. Consent was given by the patient. Immediately prior to procedure a time out was called to verify the correct patient, procedure, equipment, support staff and site/side marked as required. Patient was  prepped and draped in the usual sterile fashion.       Clinical Data: No additional findings.   Subjective: Chief Complaint  Patient presents with  . 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  She had a recent fall in August and things been hurting her worse since then.  She has had plain films and an MRI arthrogram of the right hip.  I have the reports for my review but not the actual images but I do have the report.  She does hurt both on the lateral aspect of her right hip and her groin.  She does feel like she has a leg length discrepancy do this lumbar spine and thoracic spine scoliosis and this is been a long standing issue for her.  She is been recently ambulate with a cane in her opposite hand.  She is a type I diabetic on insulin pump and is able to control her blood glucose closely that way.  She does get pain in the lateral aspect of her right hip but also in the groin and there is a clicking sensation as well.  HPI  Review of Systems She currently denies any headache, chest pain, shortness of breath, fever, chills, nausea, vomiting  Objective: Vital Signs: There were no vitals  taken for this visit.  Physical Exam She is alert and orient x3 and in no acute distress Ortho Exam Examination of her left hip is normal examination of her right painful hip shows pain over the trochanteric area but not in the groin.  I can fully internal and external rotate her hip and moves smoothly.  She has some IT band pain as well.  I do feel that she has a slight leg length discrepancy with the right side being slightly longer than her left. Specialty Comments:  No specialty comments available.  Imaging: No results found. The MRI report of her right hip does show some edema in the acetabulum suggesting a pincer effect from femoral acetabular impingement but the cartilage in her hip is normal and the labrum is  normal.  There is no hip joint effusion.  Plain films also point to a normal-appearing right hip.  PMFS History: Patient Active Problem List   Diagnosis Date Noted  . Femoroacetabular impingement of right hip 01/07/2019  . Diabetes ([facility]) 08/21/2018   Past Medical History:  Diagnosis Date  . Diabetes mellitus without complication ([facility])   . FH: juvenile onset diabetes mellitus   . Lumbar scoliosis   . Thoracic scoliosis     Family History  Problem Relation Age of Onset  . Diabetes Neg Hx     Past Surgical History:  Procedure Laterality Date  . CERVICAL DISC SURGERY    . CESAREAN SECTION     Social History   Occupational History  . Not on file  Tobacco Use  . Smoking status: Never Smoker  . Smokeless tobacco: Never Used  Substance and Sexual Activity  . Alcohol use: Yes    Comment: occasional  . Drug use: No  . Sexual activity: Not on file

## 2019-02-04 ENCOUNTER — Ambulatory Visit (INDEPENDENT_AMBULATORY_CARE_PROVIDER_SITE_OTHER): Payer: BLUE CROSS/BLUE SHIELD | Admitting: Orthopaedic Surgery

## 2019-02-04 ENCOUNTER — Encounter (INDEPENDENT_AMBULATORY_CARE_PROVIDER_SITE_OTHER): Payer: Self-pay | Admitting: Orthopaedic Surgery

## 2019-02-04 ENCOUNTER — Other Ambulatory Visit (INDEPENDENT_AMBULATORY_CARE_PROVIDER_SITE_OTHER): Payer: Self-pay

## 2019-02-04 DIAGNOSIS — M25551 Pain in right hip: Secondary | ICD-10-CM | POA: Diagnosis not present

## 2019-02-04 DIAGNOSIS — M25851 Other specified joint disorders, right hip: Secondary | ICD-10-CM

## 2019-02-04 DIAGNOSIS — M7061 Trochanteric bursitis, right hip: Secondary | ICD-10-CM | POA: Diagnosis not present

## 2019-02-04 MED ORDER — METHYLPREDNISOLONE 4 MG PO TABS: ORAL_TABLET | ORAL | 0 refills | Status: DC

## 2019-02-04 NOTE — Progress Notes (Signed)
Susan Watkins is following up for her right hip.  She did bring her MRI arthrogram so we did take a look at her right hip.  She has had chronic pain for about 10 years but worsened recently after a fall out of a hammock in August of this past year.  The MRI was obtained in January of this year.  We did perform an intra-articular steroid injection under fluoroscopy of the right hip which gave her significant relief for about a week.  She has also had a trochanteric injection and that did calm things down.  She still has pain in her groin it does wake her up at night.  The range of motion of her right hip is full.  There is just slight pain on extremes of rotation.  There is slight pain when I compress the hip.  The MRI is reviewed extensively and you can see there is some edema in the roof of the acetabulum but there is no hip joint effusion and no evidence of avascular necrosis.  The cartilage is well-maintained of the acetabulum and the femoral head.  Her signs and symptoms still seem to be consistent with a cam effect from femoral acetabular impingement.  I would like to send her to Dr. Wallene Huh at [facility] to assess her for the potential for a hip arthroscopic intervention if it is warranted.  All question concerns were answered and addressed.  We will work on making a referral.

## 2019-02-26 ENCOUNTER — Ambulatory Visit: Payer: BLUE CROSS/BLUE SHIELD | Admitting: Endocrinology

## 2019-02-26 ENCOUNTER — Other Ambulatory Visit: Payer: Self-pay

## 2019-02-26 ENCOUNTER — Encounter: Payer: Self-pay | Admitting: Endocrinology

## 2019-02-26 VITALS — BP 148/82 | HR 84 | Ht 70.0 in | Wt 196.0 lb

## 2019-02-26 DIAGNOSIS — E109 Type 1 diabetes mellitus without complications: Secondary | ICD-10-CM

## 2019-02-26 LAB — T4, FREE: Free T4: 0.72 ng/dL (ref 0.60–1.60)

## 2019-02-26 LAB — MICROALBUMIN / CREATININE URINE RATIO
Creatinine,U: 8.2 mg/dL
Microalb Creat Ratio: 8.5 mg/g (ref 0.0–30.0)
Microalb, Ur: <0.7 mg/dL (ref 0.0–1.9)

## 2019-02-26 LAB — POCT GLYCOSYLATED HEMOGLOBIN (HGB A1C): Hemoglobin A1C: 6.7 % — AB (ref 4.0–5.6)

## 2019-02-26 LAB — TSH: TSH: 1.48 u[IU]/mL (ref 0.35–4.50)

## 2019-02-26 MED ORDER — SEMAGLUTIDE(0.25 OR 0.5MG/DOS) 2 MG/1.5ML ~~LOC~~ SOPN: 0.2500 mg | PEN_INJECTOR | SUBCUTANEOUS | 11 refills | Status: DC

## 2019-02-26 NOTE — Patient Instructions (Addendum)
Your blood pressure is high today.  Please see your primary care provider soon, to have it rechecked   I have sent a prescription to your pharmacy, to add "Ozempic."  We can double this if necessary.  Please take these pump settings:  MN 0.7 units/hr 0200: 0.9 0430: 1.875 0830: 1.75 1100: 0.875 1300 0.75 1630 0.725 1700 1.1 bolus of 1 unit/8 grams carbohydrate.  continue correction bolus (which some people call "sensitivity," or "insulin sensitivity ratio," or just "isr") of 1 unit for each 80 by which your glucose exceeds 90.   Please also consider trying the "auto-mode."   Please come back for a follow-up appointment in 3 months.

## 2019-02-26 NOTE — Progress Notes (Signed)
Subjective:    Patient ID: Susan Watkins, female    DOB: 1976-01-20, 43 y.o.   MRN: [id number]  HPI Pt returns for f/u of diabetes mellitus: DM type: 1 Dx'ed: 0630 Complications: none Therapy: insulin since dx DKA: only once (at dx) Severe hypoglycemia: last episode was in 2009 Pancreatitis: never Pancreatic imaging: never Other: she has been on pump rx since 1996, and medtronic 670G, since 2018; she also used Medtronic continuous glucose monitor, but has not been using "auto mode." Interval history: She takes these pump settings: basal rates: MN 0.95 units/hr 04:30 0.9 11:00 0.85 13:00 0.875 13:30 0.825 16:30 0.85 17:30 0.9 0200: 1.1 0430: 1.2 0830: 1.1 1100: 0.975 1300 0.95 1630 0.925 1700 1.3 bolus of 1 unit/8 grams carbohydrate continue correction bolus (which some people call "sensitivity," or "insulin sensitivity ratio," or just "isr") of 1 unit for each 80 by which glucose exceeds 90.   She has been on steroids 4 weeks ago, and 1 week ago, for hip pain.  I reviewed continuous glucose monitor data.  TDD is 55 units.  Glucose varies from 50-305.  It is in general higher PC than AC.  She is in auto mode 0% of the time.   Past Medical History:  Diagnosis Date  . Diabetes mellitus without complication ([place])   . FH: juvenile onset diabetes mellitus   . Lumbar scoliosis   . Thoracic scoliosis     Past Surgical History:  Procedure Laterality Date  . CERVICAL DISC SURGERY    . CESAREAN SECTION      Social History   Socioeconomic History  . Marital status: Single    Spouse name: Not on file  . Number of children: Not on file  . Years of education: Not on file  . Highest education level: Not on file  Occupational History  . Not on file  Social Needs  . Financial resource strain: Not on file  . Food insecurity:    Worry: Not on file    Inability: Not on file  . Transportation needs:    Medical: Not on file    Non-medical: Not on file  Tobacco Use  .  Smoking status: Never Smoker  . Smokeless tobacco: Never Used  Substance and Sexual Activity  . Alcohol use: Yes    Comment: occasional  . Drug use: No  . Sexual activity: Not on file  Lifestyle  . Physical activity:    Days per week: Not on file    Minutes per session: Not on file  . Stress: Not on file  Relationships  . Social connections:    Talks on phone: Not on file    Gets together: Not on file    Attends religious service: Not on file    Active member of club or organization: Not on file    Attends meetings of clubs or organizations: Not on file    Relationship status: Not on file  . Intimate partner violence:    Fear of current or ex partner: Not on file    Emotionally abused: Not on file    Physically abused: Not on file    Forced sexual activity: Not on file  Other Topics Concern  . Not on file  Social History Narrative  . Not on file    Current Outpatient Medications on File Prior to Visit  Medication Sig Dispense Refill  . furosemide (LASIX) 20 MG tablet furosemide 20 mg tablet    . glucagon 1 MG injection Glucagon  Emergency Kit (human-recomb) 1 mg solution for injection    . Insulin Infusion Pump Supplies (PARADIGM RESERVOIR 3ML) MISC 1 Device by Does not apply route every 3 (three) days. 10 each 3  . Insulin Infusion Pump Supplies (PARADIGM SILHOUETTE FULL 43") MISC 1 Device by Does not apply route every 3 (three) days. 10 each 3  . insulin lispro (HUMALOG) 100 UNIT/ML injection For use in pump, total of 50 units per day 50 mL 3  . lisinopril (PRINIVIL,ZESTRIL) 10 MG tablet lisinopril 10 mg tablet    . methocarbamol (ROBAXIN) 500 MG tablet Take by mouth.     No current facility-administered medications on file prior to visit.     Allergies  Allergen Reactions  . Levofloxacin Other (See Comments)    Severe Tendonitis    . Sulfa Antibiotics Rash    Family History  Problem Relation Age of Onset  . Diabetes Neg Hx     BP (!) 148/82 (BP Location:  Left Arm, Patient Position: Sitting, Cuff Size: Normal)   Pulse 84   Ht _0  (1.778 m)   Wt 196 lb (88.9 kg)   SpO2 94%   BMI 28.12 kg/m   Review of Systems She has weight gain.     Objective:   Physical Exam VITAL SIGNS:  See vs page GENERAL: no distress Pulses: dorsalis pedis intact bilat.   MSK: no deformity of the feet CV: trace bilat leg edema Skin:  no ulcer on the feet.  normal color and temp on the feet. Neuro: sensation is intact to touch on the feet  Lab Results  Component Value Date   HGBA1C 6.7 (A) 02/26/2019       Assessment & Plan:  Type 1 DM: Based on the pattern of her cbg's, she needs some adjustment in her therapy.   Weight gain, new.  We discussed Ozempic.  She wants to take.  HTN: is noted today.   Patient Instructions  Your blood pressure is high today.  Please see your primary care provider soon, to have it rechecked   I have sent a prescription to your pharmacy, to add "Ozempic."  We can double this if necessary.  Please take these pump settings:  MN 0.7 units/hr 0200: 0.9 0430: 1.875 0830: 1.75 1100: 0.875 1300 0.75 1630 0.725 1700 1.1 bolus of 1 unit/8 grams carbohydrate.  continue correction bolus (which some people call "sensitivity," or "insulin sensitivity ratio," or just "isr") of 1 unit for each 80 by which your glucose exceeds 90.   Please also consider trying the "auto-mode."   Please come back for a follow-up appointment in 3 months.

## 2019-02-28 ENCOUNTER — Encounter: Payer: Self-pay | Admitting: Endocrinology

## 2019-03-12 ENCOUNTER — Encounter: Payer: Self-pay | Admitting: Endocrinology

## 2019-03-17 ENCOUNTER — Encounter: Payer: Self-pay | Admitting: Endocrinology

## 2019-03-17 NOTE — Telephone Encounter (Signed)
Please advise 

## 2019-03-18 NOTE — Telephone Encounter (Signed)
Please refer to pt request 

## 2019-03-22 NOTE — Telephone Encounter (Signed)
Please advise 

## 2019-06-04 ENCOUNTER — Encounter: Payer: Self-pay | Admitting: Endocrinology

## 2019-06-08 ENCOUNTER — Ambulatory Visit: Payer: BLUE CROSS/BLUE SHIELD | Admitting: Endocrinology

## 2019-09-21 ENCOUNTER — Other Ambulatory Visit: Payer: Self-pay

## 2019-09-21 DIAGNOSIS — Z20822 Contact with and (suspected) exposure to covid-19: Secondary | ICD-10-CM

## 2019-09-23 LAB — NOVEL CORONAVIRUS, NAA: SARS-CoV-2, NAA: NOT DETECTED

## 2019-09-27 ENCOUNTER — Other Ambulatory Visit: Payer: Self-pay | Admitting: Endocrinology

## 2019-09-27 NOTE — Telephone Encounter (Signed)
Per Dr. Ellison, unable to refill Humalog without an appt. Routing this message to the front desk for scheduling purposes.  

## 2019-09-27 NOTE — Telephone Encounter (Signed)
1.  Please schedule f/u appt 2.  Then please refill x 1, pending that appt.  

## 2019-09-27 NOTE — Telephone Encounter (Signed)
Please advise 

## 2019-09-30 NOTE — Telephone Encounter (Signed)
LMTCB and schedule appointment

## 2019-10-20 ENCOUNTER — Other Ambulatory Visit: Payer: Self-pay

## 2019-10-20 DIAGNOSIS — Z20822 Contact with and (suspected) exposure to covid-19: Secondary | ICD-10-CM

## 2019-10-22 LAB — NOVEL CORONAVIRUS, NAA: SARS-CoV-2, NAA: NOT DETECTED

## 2019-10-29 NOTE — Telephone Encounter (Signed)
LMTCB to schedule appointment x 2 °

## 2019-11-08 ENCOUNTER — Encounter: Payer: Self-pay | Admitting: Endocrinology

## 2019-11-08 NOTE — Telephone Encounter (Signed)
ATC-No VM-Mailed Letter

## 2019-12-08 ENCOUNTER — Ambulatory Visit: Payer: BC Managed Care – PPO | Attending: Internal Medicine

## 2020-07-19 DIAGNOSIS — E109 Type 1 diabetes mellitus without complications: Secondary | ICD-10-CM | POA: Diagnosis not present

## 2020-08-09 DIAGNOSIS — R002 Palpitations: Secondary | ICD-10-CM | POA: Diagnosis not present

## 2020-08-09 DIAGNOSIS — I10 Essential (primary) hypertension: Secondary | ICD-10-CM | POA: Diagnosis not present

## 2020-09-14 ENCOUNTER — Encounter: Payer: Self-pay | Admitting: Cardiology

## 2020-09-14 ENCOUNTER — Other Ambulatory Visit: Payer: Self-pay

## 2020-09-14 ENCOUNTER — Telehealth: Payer: Self-pay | Admitting: Radiology

## 2020-09-14 ENCOUNTER — Ambulatory Visit (INDEPENDENT_AMBULATORY_CARE_PROVIDER_SITE_OTHER): Payer: BC Managed Care – PPO | Admitting: Cardiology

## 2020-09-14 VITALS — BP 138/82 | HR 80 | Ht 70.0 in | Wt 189.8 lb

## 2020-09-14 DIAGNOSIS — Z79899 Other long term (current) drug therapy: Secondary | ICD-10-CM | POA: Diagnosis not present

## 2020-09-14 DIAGNOSIS — Z9189 Other specified personal risk factors, not elsewhere classified: Secondary | ICD-10-CM | POA: Diagnosis not present

## 2020-09-14 DIAGNOSIS — I10 Essential (primary) hypertension: Secondary | ICD-10-CM

## 2020-09-14 DIAGNOSIS — R002 Palpitations: Secondary | ICD-10-CM

## 2020-09-14 DIAGNOSIS — E109 Type 1 diabetes mellitus without complications: Secondary | ICD-10-CM | POA: Diagnosis not present

## 2020-09-14 DIAGNOSIS — Z7189 Other specified counseling: Secondary | ICD-10-CM

## 2020-09-14 MED ORDER — ROSUVASTATIN CALCIUM 5 MG PO TABS: 5.0000 mg | ORAL_TABLET | Freq: Every day | ORAL | 3 refills | Status: DC

## 2020-09-14 NOTE — Patient Instructions (Signed)
Medication Instructions:  Start Rosuvastatin 5 mg daily  *If you need a refill on your cardiac medications before your next appointment, please call your pharmacy*   Lab Work: Your physician recommends that you return for lab work in 3 month ( fasting lipid, LFT)  If you have labs (blood work) drawn today and your tests are completely normal, you will receive your results only by: Marland Kitchen MyChart Message (if you have MyChart) OR . A paper copy in the mail If you have any lab test that is abnormal or we need to change your treatment, we will call you to review the results.   Testing/Procedures: Our physician has recommended that you wear an  7 DAY ZIO-PATCH monitor. The Zio patch cardiac monitor continuously records heart rhythm data for up to 14 days, this is for patients being evaluated for multiple types heart rhythms. For the first 24 hours post application, please avoid getting the Zio monitor wet in the shower or by excessive sweating during exercise. After that, feel free to carry on with regular activities. Keep soaps and lotions away from the ZIO XT Patch.   Someone from our office will call to verify address and mail monitor.     Follow-Up: At [facility], you and your health needs are our priority.  As part of our continuing mission to provide you with exceptional heart care, we have created designated Provider Care Teams.  These Care Teams include your primary Cardiologist (physician) and Advanced Practice Providers (APPs -  Physician Assistants and Nurse Practitioners) who all work together to provide you with the care you need, when you need it.  We recommend signing up for the patient portal called "MyChart".  Sign up information is provided on this After Visit Summary.  MyChart is used to connect with patients for Virtual Visits (Telemedicine).  Patients are able to view lab/test results, encounter notes, upcoming appointments, etc.  Non-urgent messages can be sent to your  provider as well.   To learn more about what you can do with MyChart, go to [email or web address].    Your next appointment:   2 year(s)  The format for your next appointment:   In Person  Provider:   Jodelle Red, MD  Christena Deem- Long Term Monitor Instructions   Your physician has requested you wear your ZIO patch monitor___7____days.   This is a single patch monitor.  Ir[employer] supplies one patch monitor per enrollment.  Additional stickers are not available.   Please do not apply patch if you will be having a Nuclear Stress Test, Echocardiogram, Cardiac CT, MRI, or Chest Xray during the time frame you would be wearing the monitor. The patch cannot be worn during these tests.  You cannot remove and re-apply the ZIO XT patch monitor.   Your ZIO patch monitor will be sent USPS Priority mail from [facility] directly to your home address. The monitor may also be mailed to a PO BOX if home delivery is not available.   It may take 3-5 days to receive your monitor after you have been enrolled.   Once you have received you monitor, please review enclosed instructions.  Your monitor has already been registered assigning a specific monitor serial # to you.   Applying the monitor   Shave hair from upper left chest.   Hold abrader disc by orange tab.  Rub abrader in 40 strokes over left upper chest as indicated in your monitor instructions.   Clean area with 4 enclosed alcohol pads .  Use all pads to assure are is cleaned thoroughly.  Let dry.   Apply patch as indicated in monitor instructions.  Patch will be place under collarbone on left side of chest with arrow pointing upward.   Rub patch adhesive wings for 2 minutes.Remove white label marked "1".  Remove white label marked "2".  Rub patch adhesive wings for 2 additional minutes.   While looking in a mirror, press and release button in center of patch.  A small green light will flash 3-4 times .  This will be your only  indicator the monitor has been turned on.     Do not shower for the first 24 hours.  You may shower after the first 24 hours.   Press button if you feel a symptom. You will hear a small click.  Record Date, Time and Symptom in the Patient Log Book.   When you are ready to remove patch, follow instructions on last 2 pages of Patient Log Book.  Stick patch monitor onto last page of Patient Log Book.   Place Patient Log Book in [place] box.  Use locking tab on box and tape box closed securely.  The Orange and [employer] has [employer] on it.  Please place in mailbox as soon as possible.  Your physician should have your test results approximately 7 days after the monitor has been mailed back to [facility].   Call [facility] Customer Care at [phone] if you have questions regarding your ZIO XT patch monitor.  Call them immediately if you see an orange light blinking on your monitor.   If your monitor falls off in less than 4 days contact our Monitor department at [phone].  If your monitor becomes loose or falls off after 4 days call [employer] at [phone] for suggestions on securing your monitor.

## 2020-09-14 NOTE — Progress Notes (Signed)
Cardiology Office Note:    Date:  09/14/2020   ID:  Susan Watkins, DOB 10-28-1976, MRN [id number]  PCP:  Jettie Booze, NP  Cardiologist:  Buford Dresser, MD  Referring MD: Curly Rim, MD   CC: new patient evaluation for palpitations  History of Present Illness:    Susan Watkins is Watkins 44 y.o. female with Watkins hx of type I diabetes, hypertension who is seen as Watkins new consult at the request of [name], [name], MD for the evaluation and management of palpitations.  Note reviewed from Dr. Neta Mends dated 08/09/20. Patient noted several episodes of palpitations. Occasional dizziness with these. Has occurred over the last two weeks.   Today: Her father is Susan Watkins (my patient). 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 She has longstanding type I diabetes since 1987. Has been on antihypertensives since age 38. Also currently experiencing premature menopause.  Tachycardia/palpitations: -Initial onset: first noted earlier this year (Jan/Feb), got worse after starting hormone replacement in the summer -Frequency/Duration: since stopping hormone replacement, has been more intermittent. Happening almost every day. Last less than Watkins few minutes. -Associated symptoms: no chest pain. Feels dizzy frequently, not always related to palpitations. -Aggravating/alleviating factors: worse with hormones, stress, exercise -Syncope/near syncope: none. Was taking lasix for peripheral edema, had borderline near syncope, none since stopping lasix. -Prior cardiac history: hypertension, went very high on birth control pills. Had pre-eclampsia and emergency c-section. -Prior workup: distant stress test, normal (~2007) at [facility], echo 2018, echo stress 2012 -Prior treatment: none -Possible medication interactions: reviewed, no longer on hormone replacement -Caffeine: 4 cups of coffee in AM, then only water. Not clearly related to caffeine -Alcohol: almost none -Tobacco: never -OTC  supplements: none -Comorbidities: type I diabetes, hypertension, history of pre-eclampsia, early menopause -Exercise level: exercises every day, 45 minutes bike every other day, walks on the other days for 45 minutes. Has noticed palpiations while biking, doesn't limit her. -Labs: TSH, kidney function/electrolytes, CBC reviewed. -Cardiac ROS: no shortness of breath, no PND, no orthopnea, trivial chronic LE edema. Does have Watkins history of reflux, has occasional chest pressure with this. -Family history: father has cardiac amyloidosis (TTR), mom has afib. Grandmother just passed at age 67 with afib, heart failure.  Lipids from 11/22/19 show Tchol 169, TG 61, HDL 87, LDL 70. Discussed statin today, no plans for childbearing.   Past Medical History:  Diagnosis Date  . Diabetes mellitus without complication ([place])   . FH: juvenile onset diabetes mellitus   . Lumbar scoliosis   . Thoracic scoliosis     Past Surgical History:  Procedure Laterality Date  . CERVICAL DISC SURGERY    . CESAREAN SECTION      Current Medications: Current Outpatient Medications on File Prior to Visit  Medication Sig  . glucagon 1 MG injection Glucagon Emergency Kit (human-recomb) 1 mg solution for injection  . Insulin Infusion Pump Supplies (PARADIGM RESERVOIR 3ML) MISC 1 Device by Does not apply route every 3 (three) days.  . Insulin Infusion Pump Supplies (PARADIGM SILHOUETTE FULL 43") MISC 1 Device by Does not apply route every 3 (three) days.  . insulin lispro (HUMALOG) 100 UNIT/ML injection For use in pump, total of 50 units per day  . lisinopril (PRINIVIL,ZESTRIL) 10 MG tablet Take 20 mg by mouth daily.  . methocarbamol (ROBAXIN) 500 MG tablet Take by mouth.   No current facility-administered medications on file prior to visit.     Allergies:   Levofloxacin and Sulfa antibiotics  Social History   Tobacco Use  . Smoking status: Never Smoker  . Smokeless tobacco: Never Used  Substance Use Topics  .  Alcohol use: Yes    Comment: occasional  . Drug use: No    Family History: family history is negative for Diabetes.  ROS:   Please see the history of present illness.  Additional pertinent ROS: Constitutional: Negative for chills, fever, night sweats, unintentional weight loss  HENT: Negative for ear pain and hearing loss.   Eyes: Negative for loss of vision and eye pain.  Respiratory: Negative for cough, sputum, wheezing.   Cardiovascular: See HPI. Gastrointestinal: Negative for abdominal pain, melena, and hematochezia.  Genitourinary: Negative for dysuria and hematuria.  Musculoskeletal: Negative for falls and myalgias.  Skin: Negative for itching and rash.  Neurological: Negative for focal weakness, focal sensory changes and loss of consciousness.  Endo/Heme/Allergies: Does not bruise/bleed easily.     EKGs/Labs/Other Studies Reviewed:    The following studies were reviewed today: Can see history of studies in Care Everywhere but not full results  EKG:  EKG is personally reviewed.  The ekg ordered today demonstrates NSR at 80 bpm  Recent Labs: No results found for requested labs within last 8760 hours.  Recent Lipid Panel No results found for: CHOL, TRIG, HDL, CHOLHDL, VLDL, LDLCALC, LDLDIRECT  Physical Exam:    VS:  BP 138/82   Pulse 80   Ht '5\' 10"'  (1.778 m)   Wt 189 lb 12.8 oz (86.1 kg)   LMP  (LMP Unknown)   SpO2 99%   BMI 27.23 kg/m     Wt Readings from Last 3 Encounters:  09/14/20 189 lb 12.8 oz (86.1 kg)  02/26/19 196 lb (88.9 kg)  11/27/18 188 lb 3.2 oz (85.4 kg)    GEN: Well nourished, well developed in no acute distress HEENT: Normal, moist mucous membranes NECK: No JVD CARDIAC: regular rhythm, normal S1 and S2, no rubs or gallops. No murmurs. VASCULAR: Radial and DP pulses 2+ bilaterally. No carotid bruits RESPIRATORY:  Clear to auscultation without rales, wheezing or rhonchi  ABDOMEN: Soft, non-tender, non-distended MUSCULOSKELETAL:  Ambulates  independently SKIN: Warm and dry, no edema NEUROLOGIC:  Alert and oriented x 3. No focal neuro deficits noted. PSYCHIATRIC:  Normal affect    ASSESSMENT:    1. Palpitations   2. At increased risk for cardiovascular disease   3. Medication management   4. Type 1 diabetes mellitus without complication (HCC)   5. Essential hypertension   6. Cardiac risk counseling    PLAN:    Palpitations: -7 day Zio, echo if abnormal, instructed on monitor use -no high risk features/symptoms to this point  At increased risk for CVD: with longstanding type 1 diabetes -guidelines recommend statin no matter baseline LDL for patients >40 with at least one risk factor (HTN) -no plans for childbearing, going through premature menopause now -amenable to low dose rosuvastatin given diabetes -check Lipids, lfts 3 mos (here or with PCP)  Hypertension: -slightly elevated today, but prior notes suggest generally well controlled -continue lisinopril 20 mg daily  Cardiac risk counseling and prevention recommendations: -recommend heart healthy/Mediterranean diet, with whole grains, fruits, vegetable, fish, lean meats, nuts, and olive oil. Limit salt. -recommend moderate walking, 3-5 times/week for 30-50 minutes each session. Aim for at least 150 minutes.week. Goal should be pace of 3 miles/hours, or walking 1.5 miles in 30 minutes -recommend avoidance of tobacco products. Avoid excess alcohol. -ASCVD risk score: The ASCVD Risk score Mikey Bussing DC  Jr., et al., 2013) failed to calculate for the following reasons:   Cannot find Watkins previous HDL lab    Plan for follow up: if monitor unremarkable, would follow up every two years to optimize CV risk/prevention  Buford Dresser, MD, PhD [place]  [facility]    Medication Adjustments/Labs and Tests Ordered: Current medicines are reviewed at length with the patient today.  Concerns regarding medicines are outlined above.  Orders Placed This Encounter    Procedures  . Lipid panel  . Hepatic function panel  . LONG TERM MONITOR (3-14 DAYS)  . EKG 12-Lead   Meds ordered this encounter  Medications  . rosuvastatin (CRESTOR) 5 MG tablet    Sig: Take 1 tablet (5 mg total) by mouth daily.    Dispense:  90 tablet    Refill:  3    Patient Instructions  Medication Instructions:  Start Rosuvastatin 5 mg daily  *If you need Watkins refill on your cardiac medications before your next appointment, please call your pharmacy*   Lab Work: Your physician recommends that you return for lab work in 3 month ( fasting lipid, LFT)  If you have labs (blood work) drawn today and your tests are completely normal, you will receive your results only by: Marland Kitchen MyChart Message (if you have MyChart) OR . Watkins paper copy in the mail If you have any lab test that is abnormal or we need to change your treatment, we will call you to review the results.   Testing/Procedures: Our physician has recommended that you wear an  7 DAY ZIO-PATCH monitor. The Zio patch cardiac monitor continuously records heart rhythm data for up to 14 days, this is for patients being evaluated for multiple types heart rhythms. For the first 24 hours post application, please avoid getting the Zio monitor wet in the shower or by excessive sweating during exercise. After that, feel free to carry on with regular activities. Keep soaps and lotions away from the ZIO XT Patch.   Someone from our office will call to verify address and mail monitor.     Follow-Up: At [facility], you and your health needs are our priority.  As part of our continuing mission to provide you with exceptional heart care, we have created designated Provider Care Teams.  These Care Teams include your primary Cardiologist (physician) and Advanced Practice Providers (APPs -  Physician Assistants and Nurse Practitioners) who all work together to provide you with the care you need, when you need it.  We recommend signing up for  the patient portal called "MyChart".  Sign up information is provided on this After Visit Summary.  MyChart is used to connect with patients for Virtual Visits (Telemedicine).  Patients are able to view lab/test results, encounter notes, upcoming appointments, etc.  Non-urgent messages can be sent to your provider as well.   To learn more about what you can do with MyChart, go to [email or web address].    Your next appointment:   2 year(s)  The format for your next appointment:   In Person  Provider:   Buford Dresser, MD  [place] Instructions   Your physician has requested you wear your ZIO patch monitor___7____days.   This is Watkins single patch monitor.  [employer] supplies one patch monitor per enrollment.  Additional stickers are not available.   Please do not apply patch if you will be having Watkins Nuclear Stress Test, Echocardiogram, Cardiac CT, MRI, or Chest Xray during the time frame  you would be wearing the monitor. The patch cannot be worn during these tests.  You cannot remove and re-apply the ZIO XT patch monitor.   Your ZIO patch monitor will be sent USPS Priority mail from [facility] directly to your home address. The monitor may also be mailed to Watkins PO BOX if home delivery is not available.   It may take 3-5 days to receive your monitor after you have been enrolled.   Once you have received you monitor, please review enclosed instructions.  Your monitor has already been registered assigning Watkins specific monitor serial # to you.   Applying the monitor   Shave hair from upper left chest.   Hold abrader disc by orange tab.  Rub abrader in 40 strokes over left upper chest as indicated in your monitor instructions.   Clean area with 4 enclosed alcohol pads .  Use all pads to assure are is cleaned thoroughly.  Let dry.   Apply patch as indicated in monitor instructions.  Patch will be place under collarbone on left side of chest with arrow pointing  upward.   Rub patch adhesive wings for 2 minutes.Remove white label marked "1".  Remove white label marked "2".  Rub patch adhesive wings for 2 additional minutes.   While looking in Watkins mirror, press and release button in center of patch.  Watkins small green light will flash 3-4 times .  This will be your only indicator the monitor has been turned on.     Do not shower for the first 24 hours.  You may shower after the first 24 hours.   Press button if you feel Watkins symptom. You will hear Watkins small click.  Record Date, Time and Symptom in the Patient Log Book.   When you are ready to remove patch, follow instructions on last 2 pages of Patient Log Book.  Stick patch monitor onto last page of Patient Log Book.   Place Patient Log Book in [place] box.  Use locking tab on box and tape box closed securely.  The Orange and [employer] has [employer] on it.  Please place in mailbox as soon as possible.  Your physician should have your test results approximately 7 days after the monitor has been mailed back to [facility]   Call [place] at [phone] if you have questions regarding your ZIO XT patch monitor.  Call them immediately if you see an orange light blinking on your monitor.   If your monitor falls off in less than 4 days contact our Monitor department at [phone].  If your monitor becomes loose or falls off after 4 days call [employer] at [phone] for suggestions on securing your monitor.       Signed, Buford Dresser, MD PhD 09/14/2020 12:36 PM    [place]

## 2020-09-14 NOTE — Telephone Encounter (Signed)
Enrolled patient for a 7 day Zio Monitor to be mailed to patients home.  

## 2020-09-18 ENCOUNTER — Other Ambulatory Visit (INDEPENDENT_AMBULATORY_CARE_PROVIDER_SITE_OTHER): Payer: BC Managed Care – PPO

## 2020-09-18 DIAGNOSIS — R002 Palpitations: Secondary | ICD-10-CM

## 2020-09-20 ENCOUNTER — Ambulatory Visit: Payer: BC Managed Care – PPO | Admitting: Cardiology

## 2020-09-27 DIAGNOSIS — Z01419 Encounter for gynecological examination (general) (routine) without abnormal findings: Secondary | ICD-10-CM | POA: Diagnosis not present

## 2020-09-27 DIAGNOSIS — Z6827 Body mass index (BMI) 27.0-27.9, adult: Secondary | ICD-10-CM | POA: Diagnosis not present

## 2020-09-27 DIAGNOSIS — L659 Nonscarring hair loss, unspecified: Secondary | ICD-10-CM | POA: Diagnosis not present

## 2020-10-06 DIAGNOSIS — E109 Type 1 diabetes mellitus without complications: Secondary | ICD-10-CM | POA: Diagnosis not present

## 2020-10-18 DIAGNOSIS — E109 Type 1 diabetes mellitus without complications: Secondary | ICD-10-CM | POA: Diagnosis not present

## 2020-10-25 DIAGNOSIS — E1169 Type 2 diabetes mellitus with other specified complication: Secondary | ICD-10-CM | POA: Diagnosis not present

## 2020-10-25 DIAGNOSIS — E785 Hyperlipidemia, unspecified: Secondary | ICD-10-CM | POA: Diagnosis not present

## 2020-10-25 DIAGNOSIS — I10 Essential (primary) hypertension: Secondary | ICD-10-CM | POA: Diagnosis not present

## 2020-10-25 DIAGNOSIS — E1049 Type 1 diabetes mellitus with other diabetic neurological complication: Secondary | ICD-10-CM | POA: Diagnosis not present

## 2020-10-25 DIAGNOSIS — E1059 Type 1 diabetes mellitus with other circulatory complications: Secondary | ICD-10-CM | POA: Diagnosis not present

## 2020-12-04 DIAGNOSIS — M25551 Pain in right hip: Secondary | ICD-10-CM | POA: Diagnosis not present

## 2020-12-04 DIAGNOSIS — Z9889 Other specified postprocedural states: Secondary | ICD-10-CM | POA: Diagnosis not present

## 2020-12-06 DIAGNOSIS — Z471 Aftercare following joint replacement surgery: Secondary | ICD-10-CM | POA: Diagnosis not present

## 2020-12-06 DIAGNOSIS — Z96641 Presence of right artificial hip joint: Secondary | ICD-10-CM | POA: Diagnosis not present

## 2021-01-02 DIAGNOSIS — M25551 Pain in right hip: Secondary | ICD-10-CM | POA: Diagnosis not present

## 2021-01-16 DIAGNOSIS — E109 Type 1 diabetes mellitus without complications: Secondary | ICD-10-CM | POA: Diagnosis not present

## 2021-01-17 DIAGNOSIS — E785 Hyperlipidemia, unspecified: Secondary | ICD-10-CM | POA: Diagnosis not present

## 2021-01-17 DIAGNOSIS — E1169 Type 2 diabetes mellitus with other specified complication: Secondary | ICD-10-CM | POA: Diagnosis not present

## 2021-01-17 DIAGNOSIS — E1059 Type 1 diabetes mellitus with other circulatory complications: Secondary | ICD-10-CM | POA: Diagnosis not present

## 2021-01-17 DIAGNOSIS — E1049 Type 1 diabetes mellitus with other diabetic neurological complication: Secondary | ICD-10-CM | POA: Diagnosis not present

## 2021-01-17 DIAGNOSIS — I152 Hypertension secondary to endocrine disorders: Secondary | ICD-10-CM | POA: Diagnosis not present

## 2021-01-23 DIAGNOSIS — E109 Type 1 diabetes mellitus without complications: Secondary | ICD-10-CM | POA: Diagnosis not present

## 2021-01-26 DIAGNOSIS — M25551 Pain in right hip: Secondary | ICD-10-CM | POA: Diagnosis not present

## 2021-01-26 DIAGNOSIS — M25851 Other specified joint disorders, right hip: Secondary | ICD-10-CM | POA: Diagnosis not present

## 2021-02-09 DIAGNOSIS — M25551 Pain in right hip: Secondary | ICD-10-CM | POA: Diagnosis not present

## 2021-02-16 DIAGNOSIS — E109 Type 1 diabetes mellitus without complications: Secondary | ICD-10-CM | POA: Diagnosis not present

## 2021-02-16 DIAGNOSIS — H43823 Vitreomacular adhesion, bilateral: Secondary | ICD-10-CM | POA: Diagnosis not present

## 2021-02-20 DIAGNOSIS — M25551 Pain in right hip: Secondary | ICD-10-CM | POA: Diagnosis not present

## 2021-02-23 DIAGNOSIS — M25551 Pain in right hip: Secondary | ICD-10-CM | POA: Diagnosis not present

## 2021-02-28 DIAGNOSIS — M25551 Pain in right hip: Secondary | ICD-10-CM | POA: Diagnosis not present

## 2021-02-28 DIAGNOSIS — M7061 Trochanteric bursitis, right hip: Secondary | ICD-10-CM | POA: Diagnosis not present

## 2021-02-28 DIAGNOSIS — M24151 Other articular cartilage disorders, right hip: Secondary | ICD-10-CM | POA: Diagnosis not present

## 2021-03-07 DIAGNOSIS — M25551 Pain in right hip: Secondary | ICD-10-CM | POA: Diagnosis not present

## 2021-03-07 DIAGNOSIS — M7061 Trochanteric bursitis, right hip: Secondary | ICD-10-CM | POA: Diagnosis not present

## 2021-03-07 DIAGNOSIS — M24151 Other articular cartilage disorders, right hip: Secondary | ICD-10-CM | POA: Diagnosis not present

## 2021-03-12 DIAGNOSIS — Z794 Long term (current) use of insulin: Secondary | ICD-10-CM | POA: Diagnosis not present

## 2021-03-12 DIAGNOSIS — E109 Type 1 diabetes mellitus without complications: Secondary | ICD-10-CM | POA: Diagnosis not present

## 2021-03-14 DIAGNOSIS — M24151 Other articular cartilage disorders, right hip: Secondary | ICD-10-CM | POA: Diagnosis not present

## 2021-03-14 DIAGNOSIS — M7061 Trochanteric bursitis, right hip: Secondary | ICD-10-CM | POA: Diagnosis not present

## 2021-03-14 DIAGNOSIS — M25551 Pain in right hip: Secondary | ICD-10-CM | POA: Diagnosis not present

## 2021-03-21 DIAGNOSIS — M7061 Trochanteric bursitis, right hip: Secondary | ICD-10-CM | POA: Diagnosis not present

## 2021-03-21 DIAGNOSIS — M25551 Pain in right hip: Secondary | ICD-10-CM | POA: Diagnosis not present

## 2021-03-21 DIAGNOSIS — M24151 Other articular cartilage disorders, right hip: Secondary | ICD-10-CM | POA: Diagnosis not present

## 2021-04-03 DIAGNOSIS — M25551 Pain in right hip: Secondary | ICD-10-CM | POA: Diagnosis not present

## 2021-04-03 DIAGNOSIS — M7061 Trochanteric bursitis, right hip: Secondary | ICD-10-CM | POA: Diagnosis not present

## 2021-04-03 DIAGNOSIS — M24151 Other articular cartilage disorders, right hip: Secondary | ICD-10-CM | POA: Diagnosis not present

## 2021-04-11 DIAGNOSIS — E1049 Type 1 diabetes mellitus with other diabetic neurological complication: Secondary | ICD-10-CM | POA: Diagnosis not present

## 2021-04-11 DIAGNOSIS — M545 Low back pain, unspecified: Secondary | ICD-10-CM | POA: Diagnosis not present

## 2021-04-13 DIAGNOSIS — Z794 Long term (current) use of insulin: Secondary | ICD-10-CM | POA: Diagnosis not present

## 2021-04-13 DIAGNOSIS — E109 Type 1 diabetes mellitus without complications: Secondary | ICD-10-CM | POA: Diagnosis not present

## 2021-04-25 DIAGNOSIS — M24151 Other articular cartilage disorders, right hip: Secondary | ICD-10-CM | POA: Diagnosis not present

## 2021-04-25 DIAGNOSIS — M25551 Pain in right hip: Secondary | ICD-10-CM | POA: Diagnosis not present

## 2021-04-25 DIAGNOSIS — M7061 Trochanteric bursitis, right hip: Secondary | ICD-10-CM | POA: Diagnosis not present

## 2021-05-17 DIAGNOSIS — I10 Essential (primary) hypertension: Secondary | ICD-10-CM | POA: Diagnosis not present

## 2021-05-17 DIAGNOSIS — Z13220 Encounter for screening for lipoid disorders: Secondary | ICD-10-CM | POA: Diagnosis not present

## 2021-05-17 DIAGNOSIS — E109 Type 1 diabetes mellitus without complications: Secondary | ICD-10-CM | POA: Diagnosis not present

## 2021-05-23 DIAGNOSIS — Z13220 Encounter for screening for lipoid disorders: Secondary | ICD-10-CM | POA: Diagnosis not present

## 2021-05-23 DIAGNOSIS — I10 Essential (primary) hypertension: Secondary | ICD-10-CM | POA: Diagnosis not present

## 2021-05-23 DIAGNOSIS — E109 Type 1 diabetes mellitus without complications: Secondary | ICD-10-CM | POA: Diagnosis not present

## 2021-06-28 DIAGNOSIS — E109 Type 1 diabetes mellitus without complications: Secondary | ICD-10-CM | POA: Diagnosis not present

## 2021-07-11 DIAGNOSIS — E1169 Type 2 diabetes mellitus with other specified complication: Secondary | ICD-10-CM | POA: Diagnosis not present

## 2021-07-11 DIAGNOSIS — I10 Essential (primary) hypertension: Secondary | ICD-10-CM | POA: Diagnosis not present

## 2021-07-11 DIAGNOSIS — E1059 Type 1 diabetes mellitus with other circulatory complications: Secondary | ICD-10-CM | POA: Diagnosis not present

## 2021-07-11 DIAGNOSIS — I152 Hypertension secondary to endocrine disorders: Secondary | ICD-10-CM | POA: Diagnosis not present

## 2021-07-11 DIAGNOSIS — E1049 Type 1 diabetes mellitus with other diabetic neurological complication: Secondary | ICD-10-CM | POA: Diagnosis not present

## 2021-07-11 DIAGNOSIS — E785 Hyperlipidemia, unspecified: Secondary | ICD-10-CM | POA: Diagnosis not present

## 2021-07-12 DIAGNOSIS — E109 Type 1 diabetes mellitus without complications: Secondary | ICD-10-CM | POA: Diagnosis not present

## 2021-07-12 DIAGNOSIS — Z794 Long term (current) use of insulin: Secondary | ICD-10-CM | POA: Diagnosis not present

## 2021-07-17 DIAGNOSIS — I10 Essential (primary) hypertension: Secondary | ICD-10-CM | POA: Diagnosis not present

## 2021-07-17 DIAGNOSIS — Z23 Encounter for immunization: Secondary | ICD-10-CM | POA: Diagnosis not present

## 2021-07-17 DIAGNOSIS — E109 Type 1 diabetes mellitus without complications: Secondary | ICD-10-CM | POA: Diagnosis not present

## 2021-07-17 DIAGNOSIS — Z1211 Encounter for screening for malignant neoplasm of colon: Secondary | ICD-10-CM | POA: Diagnosis not present

## 2021-10-15 DIAGNOSIS — E109 Type 1 diabetes mellitus without complications: Secondary | ICD-10-CM | POA: Diagnosis not present

## 2021-10-15 DIAGNOSIS — Z794 Long term (current) use of insulin: Secondary | ICD-10-CM | POA: Diagnosis not present

## 2021-10-22 ENCOUNTER — Ambulatory Visit: Payer: BC Managed Care – PPO | Admitting: Internal Medicine

## 2021-10-22 ENCOUNTER — Other Ambulatory Visit: Payer: Self-pay

## 2021-10-22 ENCOUNTER — Encounter: Payer: Self-pay | Admitting: Internal Medicine

## 2021-10-22 VITALS — BP 124/72 | HR 72 | Ht 70.0 in | Wt 192.0 lb

## 2021-10-22 DIAGNOSIS — E109 Type 1 diabetes mellitus without complications: Secondary | ICD-10-CM | POA: Diagnosis not present

## 2021-10-22 LAB — POCT GLYCOSYLATED HEMOGLOBIN (HGB A1C): Hemoglobin A1C: 7.3 % — AB (ref 4.0–5.6)

## 2021-10-22 MED ORDER — HUMALOG 100 UNIT/ML ~~LOC~~ SOLN: SUBCUTANEOUS | 3 refills | Status: DC

## 2021-10-22 NOTE — Progress Notes (Signed)
Name: Susan Watkins  MRN/ DOB: [id number], 04/15/1976   Age/ Sex: 45 y.o., female    PCP: Jettie Booze, NP   Reason for Endocrinology Evaluation: Type 1 Diabetes Mellitus     Date of Initial Endocrinology Visit: 10/22/2021     PATIENT IDENTIFIER: Susan Watkins is a 45 y.o. female with a past medical history of T1DM and Dyslipidemia. The patient presented for initial endocrinology clinic visit on 10/22/2021 for consultative assistance with her diabetes management.    HPI: Ms. Kwasny was    Diagnosed with DM at age 80 Prior Medications tried/Intolerance: Skin reaction to Lyumjev. On the Medtronic Pump . Has been on the medtronic since the age 42  Currently checking blood sugars multiple  x / day,  through CGM Hypoglycemia episodes : no               Symptoms: yes                Hemoglobin A1c has ranged from 6.7%  in 07/2021, peaking at 6.9% in 2019. Patient required assistance for hypoglycemia: no Patient has required hospitalization within the last 1 year from hyper or hypoglycemia:   In terms of diet, the patient eats 3 meals a day, snacks   Has musculoskeletal issues  Has a 45 yr old boy, had pre-eclampsia   Transferred care from Dr. Hartford Poli    This patient with type 1 diabetes is treated with Medtronic (insulin pump). During the visit the pump basal and bolus doses were reviewed including carb/insulin rations and supplemental doses. The clinical list was updated. The glucose meter download was reviewed in detail to determine if the current pump settings are providing the best glycemic control without excessive hypoglycemia.  Pump and meter download:    Pump   Medtronic  Settings   Insulin type   Humalog   Basal rate       0000 0.850 u/h    0430 1.0  u/h    0800 0.975 u/h    1300 0.875   1730 0.850   2000 0850      I:C ratio       0000 9.5   0830 10   1300 10      Sensitivity       0000  70      Goal       0000  100            Type & Model of  Pump: Medtronic Insulin Type: Currently using Humalog .   PUMP ADHERENCE: Bolus Wizard - using most of the time - with food and correction  Bolus Wizard Override- used never  - but does have frequent manual boluses  Infusion Set Changes- q 5 days     CURRENT PUMP STATISTICS: Auto mode =0 % Manual mode =100% Sensor wear = 30% Average BG: 167 Average SG: 14 BG checks / Calibrations/day: 3.7 Average Daily Carbs (g): 156 +/- 50 Average Total Daily Insulin: 47.6 units Average Daily Bolus:  26 units (55%) Average Daily Basal: 21.6 units (45%) Time in Range: High (>250) =1 % Above target (180-250) = 23% In Range (70-180) = 74% Below Target (<70 but >50) = 2% Low (<50) = 0%    HOME DIABETES REGIMEN: Humalog    Statin: Crestor caused joint pains  ACE-I/ARB: no Prior Diabetic Education: yes   DIABETIC COMPLICATIONS: Microvascular complications:   Denies: neuropathy, CKD , neuropathy Last eye exam: Completed   Macrovascular complications:  Denies: CAD, PVD, CVA   PAST HISTORY: Past Medical History:  Past Medical History:  Diagnosis Date   Diabetes mellitus without complication ([place])    FH: juvenile onset diabetes mellitus    Lumbar scoliosis    Thoracic scoliosis    Past Surgical History:  Past Surgical History:  Procedure Laterality Date   CERVICAL DISC SURGERY     CESAREAN SECTION      Social History:  reports that she has never smoked. She has never used smokeless tobacco. She reports current alcohol use. She reports that she does not use drugs. Family History:  Family History  Problem Relation Age of Onset   Diabetes Neg Hx      HOME MEDICATIONS: Allergies as of 10/22/2021       Reactions   Levofloxacin Other (See Comments)   Severe Tendonitis    Lyumjev [insulin Lispro]    Skin reaction    Sulfa Antibiotics Rash        Medication List        Accurate as of October 22, 2021  8:40 AM. If you have any questions, ask your nurse or  doctor.          STOP taking these medications    rosuvastatin 5 MG tablet Commonly known as: CRESTOR Stopped by: Dorita Sciara, MD       TAKE these medications    glucagon 1 MG injection Glucagon Emergency Kit (human-recomb) 1 mg solution for injection   insulin lispro 100 UNIT/ML injection Commonly known as: HumaLOG For use in pump, total of 50 units per day   lisinopril 10 MG tablet Commonly known as: ZESTRIL Take 20 mg by mouth daily.   methocarbamol 500 MG tablet Commonly known as: ROBAXIN Take by mouth.   Paradigm Silhouette Full 43" Misc 1 Device by Does not apply route every 3 (three) days.   PARADIGM RESERVOIR 3ML Misc 1 Device by Does not apply route every 3 (three) days.         ALLERGIES: Allergies  Allergen Reactions   Levofloxacin Other (See Comments)    Severe Tendonitis     Lyumjev [Insulin Lispro]     Skin reaction    Sulfa Antibiotics Rash     REVIEW OF SYSTEMS: A comprehensive ROS was conducted with the patient and is negative except as per HPI and below:  Review of Systems  Gastrointestinal:  Negative for diarrhea, nausea and vomiting.     OBJECTIVE:   VITAL SIGNS: BP 124/72 (BP Location: Left Arm, Patient Position: Sitting, Cuff Size: Small)   Pulse 72   Ht '5\' 10"'  (1.778 m)   Wt 192 lb (87.1 kg)   LMP  (LMP Unknown)   SpO2 99%   BMI 27.55 kg/m    PHYSICAL EXAM:  General: Pt appears well and is in NAD  Neck: General: Supple without adenopathy or carotid bruits. Thyroid: Thyroid size normal.  No goiter or nodules appreciated.   Lungs: Clear with good BS bilat with no rales, rhonchi, or wheezes  Heart: RRR with normal S1 and S2 and no gallops; no murmurs; no rub  Abdomen: Normoactive bowel sounds, soft, nontender, without masses or organomegaly palpable  Extremities:  Lower extremities - No pretibial edema. No lesions.  Skin: Normal texture and temperature to palpation. No rash noted.   Neuro: MS is good with  appropriate affect, pt is alert and Ox3    DM foot exam: 10/22/2021  The skin of the feet is intact without sores  or ulcerations. The pedal pulses are 2+ on right and 2+ on left. The sensation is intact to a screening 5.07, 10 gram monofilament bilaterally    DATA REVIEWED:  Lab Results  Component Value Date   HGBA1C 7.3 (A) 10/22/2021   HGBA1C 6.7 (A) 02/26/2019   HGBA1C 6.9 (A) 11/27/2018   05/23/2021 Tg 66 HDL 70 LDL 80 TSH 1.630 MA/Cr ratio 4 Cr 0.68 BUN 14 GFR 109   ASSESSMENT / PLAN / RECOMMENDATIONS:   1) Type 1 Diabetes Mellitus, Optimally controlled, Without complications - Most recent A1c of  7.3 %. Goal A1c < 7.0 %.     - She has done well over the year with diabetes self care. She has the Omnipod 770 but she is 100% in the manual mode. She does tend to manually bolus multiple times a day.  - I have recommended exploring the T-slim , will refer to our CDE , she may be under warranty, may have to write a letter to her insurance  - She has been noted with occasional hyperglycemia at night, there's no pattern to this, I have asked her  to pay close attention and observe if these have to do with prolonged insulin use ( pt changes pump Q 5 days rather 3 days) If this does not resolve will consider increasing basal rate in the evening  - No changes at this time   MEDICATIONS: Humalog   Pump   Medtronic  Settings   Insulin type   Humalog   Basal rate       0000 0.850 u/hr   0430 1.0     0800 0.975    1300 0.875   1730 0.850   2000 0850      I:C ratio       0000 9.5   0830 10   1300 10      Sensitivity       0000  70      Goal       0000  100        EDUCATION / INSTRUCTIONS: BG monitoring instructions: Patient is instructed to check her blood sugars 3 times a day, before meals . Call [facility] if: BG persistently < 70  I reviewed the Rule of 15 for the treatment of hypoglycemia in detail with the patient. Literature  supplied.   2) Diabetic complications:  Eye: Does not have known diabetic retinopathy.  Neuro/ Feet: Does not have known diabetic peripheral neuropathy. Renal: Patient does not have known baseline CKD. She is  on an ACEI/ARB at present.   3) At Risk for CVD:  - She was on rosuvastatin 5 mg daily but that caused severe joint aches and pain.  - Discussed cardiovascular benefits of statin, I have recommended taking it 2-3 x a week but the pain was so severe she is not willing to try .      F/U in 4 months    Signed electronically by: Mack Guise, MD  [facility]  [facility] [address] [place], [place] [address] Phone: [phone] FAX: [phone]   CC: Jettie Booze, NP [place] [address] [place] [address] Phone: [phone]  Fax: [phone]    Return to Endocrinology clinic as below: Future Appointments  Date Time Provider [place]  10/22/2021  8:50 AM Patty Lopezgarcia, Melanie Crazier, MD [facility] None

## 2021-10-22 NOTE — Patient Instructions (Signed)
   Pump   Medtronic  Settings   Insulin type   Humalog   Basal rate       0000 0.850 u/hr   0430 1.0     0800 0.975    1300 0.875   1730 0.850   2000 0850      I:C ratio       0000 9.5   0830 10   1300 10      Sensitivity       0000  70      Goal       0000  100

## 2021-10-23 DIAGNOSIS — M25511 Pain in right shoulder: Secondary | ICD-10-CM | POA: Diagnosis not present

## 2021-10-23 DIAGNOSIS — M25512 Pain in left shoulder: Secondary | ICD-10-CM | POA: Diagnosis not present

## 2021-10-29 ENCOUNTER — Encounter: Payer: Self-pay | Admitting: Internal Medicine

## 2021-11-28 ENCOUNTER — Ambulatory Visit: Payer: BC Managed Care – PPO | Admitting: Nutrition

## 2021-12-05 ENCOUNTER — Other Ambulatory Visit: Payer: Self-pay

## 2021-12-05 ENCOUNTER — Encounter: Payer: BC Managed Care – PPO | Attending: Internal Medicine | Admitting: Nutrition

## 2021-12-05 DIAGNOSIS — E109 Type 1 diabetes mellitus without complications: Secondary | ICD-10-CM | POA: Diagnosis not present

## 2021-12-09 NOTE — Patient Instructions (Signed)
Go to Tandem website  and chat rooms to learn about this pump.  Call if questions

## 2021-12-09 NOTE — Progress Notes (Signed)
Patient is here to day at request of Dr. Lonzo Cloud.  She is happy with using the medtronic pump without the CGM, because she says this is very inaccurate. We discussed how the cgm is a measurement of interstitital fluid and not blood sugar levels,  She reports good understanding of this, but was not aware of the need to adjust dosage on bolus wizard when arrows are angled up/down by 20-30 points, nor what to do when arrows are straight up/down and it is time to calibrate the sensor.  She says she is totally against this sensor, and was shown the Dexcom and Tandem insulin pump.  She was encouraged to go their websites/chat rooms, to listen to what patient's on this pump are saying.  She agreed to do this, and agreed to try this pump.  She filled out paperwork, and this  was faxed to determine cost, and if she is even able to do this.  She is not sure how long she has been on this current pump, or when she last ordered her Medtroni 770 pump.  If she gets this, she will call me for training.   She had no final questions.

## 2021-12-11 DIAGNOSIS — J0101 Acute recurrent maxillary sinusitis: Secondary | ICD-10-CM | POA: Diagnosis not present

## 2021-12-21 ENCOUNTER — Encounter: Payer: Self-pay | Admitting: Internal Medicine

## 2021-12-26 ENCOUNTER — Telehealth: Payer: Self-pay | Admitting: Internal Medicine

## 2021-12-26 NOTE — Telephone Encounter (Signed)
Can you please call the pt and ask her is she would like to proceed with the Tandem pump ?    I sent a portal message a few days ago but she has not read the message yet     She met with [name] and per that note she would like to have the Tandem but her message to me said no   Can you please verify ?    Thanks

## 2021-12-26 NOTE — Telephone Encounter (Signed)
Vm left for patient to callback and advise if she would like to use Tandem pump.

## 2021-12-26 NOTE — Telephone Encounter (Signed)
Attempted to contact patient again no answer 

## 2021-12-27 NOTE — Telephone Encounter (Signed)
Patient responded back through [employer]

## 2022-01-15 DIAGNOSIS — E1065 Type 1 diabetes mellitus with hyperglycemia: Secondary | ICD-10-CM | POA: Diagnosis not present

## 2022-01-16 DIAGNOSIS — M25551 Pain in right hip: Secondary | ICD-10-CM | POA: Diagnosis not present

## 2022-01-20 ENCOUNTER — Encounter: Payer: Self-pay | Admitting: Internal Medicine

## 2022-01-21 ENCOUNTER — Other Ambulatory Visit: Payer: Self-pay

## 2022-01-21 MED ORDER — HUMALOG 100 UNIT/ML ~~LOC~~ SOLN: SUBCUTANEOUS | 3 refills | Status: DC

## 2022-01-21 NOTE — Telephone Encounter (Signed)
Script sent in

## 2022-01-23 ENCOUNTER — Telehealth: Payer: Self-pay | Admitting: Nutrition

## 2022-02-06 DIAGNOSIS — M25551 Pain in right hip: Secondary | ICD-10-CM | POA: Diagnosis not present

## 2022-02-13 ENCOUNTER — Encounter: Payer: BC Managed Care – PPO | Attending: Internal Medicine | Admitting: Nutrition

## 2022-02-13 ENCOUNTER — Other Ambulatory Visit: Payer: Self-pay

## 2022-02-13 DIAGNOSIS — E109 Type 1 diabetes mellitus without complications: Secondary | ICD-10-CM | POA: Insufficient documentation

## 2022-02-13 NOTE — Telephone Encounter (Signed)
Appointment scheduled for pump start on 02/13/22 ?

## 2022-02-13 NOTE — Patient Instructions (Addendum)
Read over handouts given on how to  change a cartridge/start and stop the pump/give a bolus/ control IQ symbol meanings, using the phone bolus.   Read over pump manual Call tandem help line if questions.

## 2022-02-13 NOTE — Progress Notes (Signed)
Susan Watkins was trained on the Tandem Control IQ pump.  Settings were transferred from her medtronic pump:  Basal rate: MN: 0.85, 2AM: 1.0, 4:30AM: 0.95, 8:30AM: 0.95, 11AM: 0.875, 1PM: 0.825, 5PM: 0.975, 9:30PM: 0.875.  I/C: 11, ISF: MN: 65, 4AM: 55, target 90, timing 3 hours. ?Control IQ was turned on and Dexcom was linked to pump.  Pt. Will download clarity app and was given a linking code and directions on how to link her readings to [place].   ?Her Tandem pump was linked to [place].  She was shown how to bolus x2 and re demonstrated this correctly.  Also how to do a correction dose.   ?She filled a cartridge with Humalog insulin and attached the infusion set to her abdomen.  Her Dexcom has not had time to link to the Tandem pump due to a 2 hour warm up period.  I explained how the control IQ will work, and how the sleep mode works and how to use the exercise mode.  She reported good understanding of all of the above and signed the checklist as understanding all topics with no final questions. ?

## 2022-02-20 ENCOUNTER — Ambulatory Visit: Payer: BC Managed Care – PPO | Admitting: Internal Medicine

## 2022-02-20 ENCOUNTER — Other Ambulatory Visit: Payer: Self-pay

## 2022-02-20 ENCOUNTER — Encounter: Payer: Self-pay | Admitting: Internal Medicine

## 2022-02-20 VITALS — BP 160/92 | HR 81 | Ht 70.0 in | Wt 190.4 lb

## 2022-02-20 DIAGNOSIS — E109 Type 1 diabetes mellitus without complications: Secondary | ICD-10-CM

## 2022-02-20 LAB — POCT GLYCOSYLATED HEMOGLOBIN (HGB A1C): Hemoglobin A1C: 6.9 % — AB (ref 4.0–5.6)

## 2022-02-20 NOTE — Progress Notes (Signed)
?Name: Susan Watkins  ?MRN/ DOB: [id number], February 10, 1976   ?Age/ Sex: 46 y.o., female   ? ?PCP: Jettie Booze, NP   ?Reason for Endocrinology Evaluation: Type 1 Diabetes Mellitus  ?   ?Date of Initial Endocrinology Visit: 10/22/2021  ? ? ?PATIENT IDENTIFIER: Susan Watkins is a 46 y.o. female with a past medical history of T1DM and Dyslipidemia. The patient presented for initial endocrinology clinic visit on 10/22/2021 for consultative assistance with her diabetes management.  ? ? ?HPI: ?Susan Watkins was  ? ? ?Diagnosed with DM at age 84 ?Prior Medications tried/Intolerance: Skin reaction to Lyumjev. On the Medtronic Pump . Has been on the medtronic since the age 67, switched to Tandem 02/2022             ?Hemoglobin A1c has ranged from 6.7%  in 07/2021, peaking at 6.9% in 2019. ? ? ?Has musculoskeletal issues  ?Has a 46 yr old boy, had pre-eclampsia  ? ?Transferred care from Dr. Hartford Poli  ? ? ? ? ?SUBJECTIVE:  ? ?During the last visit (10/22/2021): A1c 7.3 % , no changes  ? ?Today (02/20/22): Susan Watkins   is here for a follow up on diabetes management. She checks her blood sugars multiple  times daily. The patient has  had hypoglycemic episodes since the last clinic visit, which typically occur around 4 am ? ? ? ? ?This patient with type 1 diabetes is treated with Medtronic (insulin pump). During the visit the pump basal and bolus doses were reviewed including carb/insulin rations and supplemental doses. The clinical list was updated. The glucose meter download was reviewed in detail to determine if the current pump settings are providing the best glycemic control without excessive hypoglycemia. ? ?Pump and meter download: ? ? ? ?Pump   Tandem  Settings  CF  Carb ratio [place] BG  ?Insulin type   Humalog      ?Basal rate         ? 0000 0.850 u/h  65 11 90  ? 0200 1.00 65 11 90  ? 0400 1.000   55 11 90  ? 430 0.950  55 11 90  ? 1100 0.900 55 10 90  ? 1300 0.750 55 11 90  ? 1700 0.975 55 11 90  ? 2130 0.900 55 11 90  ?        ?       ?  ?  ? ? ? ? ? ? ?Type & Model of Pump: Tandem ?Insulin Type: Currently using Humalog . ? ? ? ?  ?CURRENT PUMP STATISTICS: ?Average BG: 174 ?Average Daily Carbs (g): 178 ?Average Total Daily Insulin: 19.36 units ?Average Daily Bolus:  9.20 units (48%) ?Average Daily Basal: 20 units (53%) ?Time in Range: ? ?Above target >180= 21% ?In Range (70-180) = 77% ?Below Target (<70 but >50) = 2% ?Low (<50) = 0% ? ? ? ?HOME DIABETES REGIMEN: ?Humalog  ? ? ?Statin: Crestor caused joint pains  ?ACE-I/ARB: no ?Prior Diabetic Education: yes ? ? ?DIABETIC COMPLICATIONS: ?Microvascular complications:  ? ?Denies: neuropathy, CKD , neuropathy ?Last eye exam: Completed  ? ?Macrovascular complications:  ? ?Denies: CAD, PVD, CVA ? ? ?PAST HISTORY: ?Past Medical History:  ?Past Medical History:  ?Diagnosis Date  ? Diabetes mellitus without complication ([place])   ? FH: juvenile onset diabetes mellitus   ? Lumbar scoliosis   ? Thoracic scoliosis   ? ?Past Surgical History:  ?Past Surgical History:  ?Procedure Laterality Date  ? CERVICAL DISC  SURGERY    ? CESAREAN SECTION    ?  ?Social History:  reports that she has never smoked. She has never used smokeless tobacco. She reports current alcohol use. She reports that she does not use drugs. ?Family History:  ?Family History  ?Problem Relation Age of Onset  ? Diabetes Neg Hx   ? ? ? ?HOME MEDICATIONS: ?Allergies as of 02/20/2022   ? ?   Reactions  ? Levofloxacin Other (See Comments)  ? Severe Tendonitis   ? Lyumjev [insulin Lispro]   ? Skin reaction   ? Sulfa Antibiotics Rash  ? ?  ? ?  ?Medication List  ?  ? ?  ? Accurate as of February 20, 2022  1:21 PM. If you have any questions, ask your nurse or doctor.  ?  ?  ? ?  ? ?amLODipine 5 MG tablet ?Commonly known as: NORVASC ?Take 5 mg by mouth daily. ?  ?glucagon 1 MG injection ?Glucagon Emergency Kit (human-recomb) 1 mg solution for injection ?  ?HumaLOG 100 UNIT/ML injection ?Generic drug: insulin lispro ?For use in pump, total of 55  units per day ?  ?lisinopril 10 MG tablet ?Commonly known as: ZESTRIL ?Take 20 mg by mouth daily. ?  ?methocarbamol 500 MG tablet ?Commonly known as: ROBAXIN ?Take by mouth. ?  ?Paradigm Silhouette Full 43" Misc ?1 Device by Does not apply route every 3 (three) days. ?  ?PARADIGM RESERVOIR 3ML Misc ?1 Device by Does not apply route every 3 (three) days. ?  ? ?  ? ? ? ?ALLERGIES: ?Allergies  ?Allergen Reactions  ? Levofloxacin Other (See Comments)  ?  Severe Tendonitis  ?  ? Lyumjev [Insulin Lispro]   ?  Skin reaction   ? Sulfa Antibiotics Rash  ? ? ? ?  ?OBJECTIVE:  ? ?VITAL SIGNS: BP (!) 160/92   Pulse 81   Ht '5\' 10"'  (1.778 m)   Wt 190 lb 6.4 oz (86.4 kg)   LMP  (LMP Unknown)   SpO2 98%   BMI 27.32 kg/m?   ? ?PHYSICAL EXAM:  ?General: Pt appears well and is in NAD  ?Neck: General: Supple without adenopathy or carotid bruits. ?Thyroid: Thyroid size normal.  No goiter or nodules appreciated.   ?Lungs: Clear with good BS bilat with no rales, rhonchi, or wheezes  ?Heart: RRR with normal S1 and S2 and no gallops; no murmurs; no rub  ?Abdomen: Normoactive bowel sounds, soft, nontender, without masses or organomegaly palpable  ?Extremities:  ?Lower extremities - No pretibial edema. No lesions.  ?Skin: Normal texture and temperature to palpation. No rash noted.   ?Neuro: MS is good with appropriate affect, pt is alert and Ox3  ? ? ?DM foot exam: 10/22/2021 ? ?The skin of the feet is intact without sores or ulcerations. ?The pedal pulses are 2+ on right and 2+ on left. ?The sensation is intact to a screening 5.07, 10 gram monofilament bilaterally ? ? ? ?DATA REVIEWED: ? ?Lab Results  ?Component Value Date  ? HGBA1C 7.3 (A) 10/22/2021  ? HGBA1C 6.7 (A) 02/26/2019  ? HGBA1C 6.9 (A) 11/27/2018  ? ?05/23/2021 ?Tg 66 ?HDL 70 ?LDL 80 ?TSH 1.630 ?MA/Cr ratio 4 ?Cr 0.68 ?BUN 14 ?GFR 109 ? ? ?ASSESSMENT / PLAN / RECOMMENDATIONS:  ? ?1) Type 1 Diabetes Mellitus, Optimally controlled, Without complications - Most recent A1c of   6.9 %. Goal A1c < 7.0 %.   ? ? ?- She is satisfied with the Tandem pump  ?- She  self adjusts which I have discouraged her from doing this. For example due to hypoglycemia at 4 am, she reduced her basal rate starting at midnight, which I disagree with.  ?- We discussed the following changes to her basal rate overnight  ? ? ? ?MEDICATIONS: ?Humalog  ? ?Pump   Medtronic  Settings   ?Insulin type   Humalog   ?Basal rate      ? 0000 0.850 u/hr  ? 0200 0.900  ? 0400 0.800  ? 0430 0.800  ? 1100 0.900  ? 1300 0.750  ? 1700 0.975  ? 2130 0.900  ?    ?I:C ratio      ? 0000 11  ? 1100 10  ? 1300 11  ?    ?Sensitivity      ? 0000  65  ? 0400 55  ?Goal      ? 0000  90  ?  ?  ? ? ?EDUCATION / INSTRUCTIONS: ?BG monitoring instructions: Patient is instructed to check her blood sugars 3 times a day, before meals . ?Call [facility] if: BG persistently < 70  ?I reviewed the Rule of 15 for the treatment of hypoglycemia in detail with the patient. Literature supplied. ? ? ?2) Diabetic complications:  ?Eye: Does not have known diabetic retinopathy.  ?Neuro/ Feet: Does not have known diabetic peripheral neuropathy. ?Renal: Patient does not have known baseline CKD. She is  on an ACEI/ARB at present. ? ? ?3) At Risk for CVD: ? ?- She was on rosuvastatin 5 mg daily but that caused severe joint aches and pain.  ?- Discussed cardiovascular benefits of statin, I have recommended taking it 2-3 x a week but the pain was so severe she has not been willing to try ? ? ? ? ?F/U in 6 months  ? ? ?Signed electronically by: ?Abby Nena Jordan, MD ? ?[facility]  ?[facility] ?[address] ?[place], [place] [address] ?Phone: [phone] ?FAX: [phone]  ? ?CC: ?Jettie Booze, NP ?[place] ?[place] [address] ?Phone: [phone]  ?Fax: [phone] ? ? ? ?Return to Endocrinology clinic as below: ?No future appointments. ?  ? ?

## 2022-02-21 ENCOUNTER — Encounter: Payer: Self-pay | Admitting: Internal Medicine

## 2022-03-05 DIAGNOSIS — K2100 Gastro-esophageal reflux disease with esophagitis, without bleeding: Secondary | ICD-10-CM | POA: Diagnosis not present

## 2022-03-05 DIAGNOSIS — E1059 Type 1 diabetes mellitus with other circulatory complications: Secondary | ICD-10-CM | POA: Diagnosis not present

## 2022-03-05 DIAGNOSIS — Z0000 Encounter for general adult medical examination without abnormal findings: Secondary | ICD-10-CM | POA: Diagnosis not present

## 2022-03-05 DIAGNOSIS — E109 Type 1 diabetes mellitus without complications: Secondary | ICD-10-CM | POA: Diagnosis not present

## 2022-03-05 DIAGNOSIS — I10 Essential (primary) hypertension: Secondary | ICD-10-CM | POA: Diagnosis not present

## 2022-03-05 DIAGNOSIS — Z13220 Encounter for screening for lipoid disorders: Secondary | ICD-10-CM | POA: Diagnosis not present

## 2022-03-05 DIAGNOSIS — I493 Ventricular premature depolarization: Secondary | ICD-10-CM | POA: Diagnosis not present

## 2022-04-15 DIAGNOSIS — E1065 Type 1 diabetes mellitus with hyperglycemia: Secondary | ICD-10-CM | POA: Diagnosis not present

## 2022-05-09 ENCOUNTER — Encounter: Payer: Self-pay | Admitting: Internal Medicine

## 2022-05-10 ENCOUNTER — Other Ambulatory Visit: Payer: Self-pay | Admitting: Endocrinology

## 2022-05-10 MED ORDER — INSULIN GLARGINE-YFGN 100 UNIT/ML ~~LOC~~ SOLN: 12.0000 [IU] | Freq: Two times a day (BID) | SUBCUTANEOUS | 0 refills | Status: DC

## 2022-05-29 ENCOUNTER — Encounter: Payer: Self-pay | Admitting: Internal Medicine

## 2022-06-14 ENCOUNTER — Other Ambulatory Visit: Payer: Self-pay

## 2022-06-14 DIAGNOSIS — E109 Type 1 diabetes mellitus without complications: Secondary | ICD-10-CM

## 2022-06-14 MED ORDER — INSULIN GLARGINE-YFGN 100 UNIT/ML ~~LOC~~ SOLN: 12.0000 [IU] | Freq: Two times a day (BID) | SUBCUTANEOUS | 0 refills | Status: DC

## 2022-06-19 DIAGNOSIS — L578 Other skin changes due to chronic exposure to nonionizing radiation: Secondary | ICD-10-CM | POA: Diagnosis not present

## 2022-06-26 ENCOUNTER — Ambulatory Visit: Payer: BC Managed Care – PPO | Admitting: Internal Medicine

## 2022-06-26 ENCOUNTER — Encounter: Payer: Self-pay | Admitting: Internal Medicine

## 2022-06-26 VITALS — BP 136/82 | HR 70 | Ht 70.0 in | Wt 193.8 lb

## 2022-06-26 DIAGNOSIS — E28319 Asymptomatic premature menopause: Secondary | ICD-10-CM | POA: Diagnosis not present

## 2022-06-26 DIAGNOSIS — E109 Type 1 diabetes mellitus without complications: Secondary | ICD-10-CM

## 2022-06-26 DIAGNOSIS — R635 Abnormal weight gain: Secondary | ICD-10-CM | POA: Diagnosis not present

## 2022-06-26 LAB — POCT GLYCOSYLATED HEMOGLOBIN (HGB A1C): Hemoglobin A1C: 6.9 % — AB (ref 4.0–5.6)

## 2022-06-26 NOTE — Progress Notes (Signed)
Name: Susan Watkins  MRN/ DOB: [id number], 05/06/1976   Age/ Sex: 46 y.o., female    PCP: Jettie Booze, NP   Reason for Endocrinology Evaluation: Type 1 Diabetes Mellitus     Date of Initial Endocrinology Visit: 10/22/2021    PATIENT IDENTIFIER: [name] is a 46 y.o. female with a past medical history of T1DM and Dyslipidemia. The patient presented for initial endocrinology clinic visit on 10/22/2021 for consultative assistance with her diabetes management.    HPI: [name] was    Diagnosed with DM at age 50 Prior Medications tried/Intolerance: Skin reaction to Lyumjev. On the Medtronic Pump . Has been on the medtronic since the age 21, switched to Tandem 02/2022             Hemoglobin A1c has ranged from 6.7%  in 07/2021, peaking at 6.9% in 2019.   Has musculoskeletal issues  Has a 46 yr old boy, had pre-eclampsia   Transferred care from Dr. Hartford Poli      SUBJECTIVE:   During the last visit (02/20/2022): A1c 6.9 % , no changes   Today (06/26/22): Mr. Susan Watkins   is here for a follow up on diabetes management. She checks her blood sugars multiple  times daily. The patient has  had hypoglycemic episodes since the last clinic visit, which typically occur around 4 am  Denies nausea, vomiting or diarrhea  Denies tingling or numbness of the feet , but has plantar fasciitis and tendinis , does not see a specialist, able to manage at home   She was diagnosed with early menopause through her Gyn at age 61, was offered HRT but did not want to continue taking them C/o weight gain     This patient with type 1 diabetes is treated with Medtronic (insulin pump). During the visit the pump basal and bolus doses were reviewed including carb/insulin rations and supplemental doses. The clinical list was updated. The glucose meter download was reviewed in detail to determine if the current pump settings are providing the best glycemic control without excessive hypoglycemia.  Pump and  meter download:    Pump   Tandem  Settings  CF  Carb ratio Targer BG  Insulin type   Humalog      Basal rate          0000 0.900 u/h  65 11 90   0200 0825 65 11 90   0430 1.000 65 13 90   1100 0.850 65 13 90   1300 0.850 65 13 90   1400 0.770 65 13 90   1800 1.200 65 11 90   2100 1.200 65 11 90                          Type & Model of Pump: Tandem Insulin Type: Currently using Humalog .      CURRENT PUMP STATISTICS: Average BG:166 Average Daily Carbs (g): 153 Average Total Daily Insulin:44.01 Average Daily Bolus:  20 units (44%) Average Daily Basal:25 units (56%) Time in Range:  Above target >180= 27% In Range (70-180) = 71% Below Target (<70 but >50) = 1% Low (<50) = 0%    HOME DIABETES REGIMEN: Humalog    Statin: Crestor caused joint pains  ACE-I/ARB: no Prior Diabetic Education: yes   DIABETIC COMPLICATIONS: Microvascular complications:   Denies: neuropathy, CKD , neuropathy Last eye exam: Completed 2022  Macrovascular complications:   Denies: CAD, PVD, CVA   PAST HISTORY:  Past Medical History:  Past Medical History:  Diagnosis Date   Diabetes mellitus without complication ([place])    FH: juvenile onset diabetes mellitus    Lumbar scoliosis    Thoracic scoliosis    Past Surgical History:  Past Surgical History:  Procedure Laterality Date   CERVICAL DISC SURGERY     CESAREAN SECTION      Social History:  reports that she has never smoked. She has never used smokeless tobacco. She reports current alcohol use. She reports that she does not use drugs. Family History:  Family History  Problem Relation Age of Onset   Diabetes Neg Hx      HOME MEDICATIONS: Allergies as of 06/26/2022       Reactions   Levofloxacin Other (See Comments)   Severe Tendonitis    Lyumjev [insulin Lispro]    Skin reaction    Sulfa Antibiotics Rash        Medication List        Accurate as of June 26, 2022  9:11 AM. If you have any questions,  ask your nurse or doctor.          amLODipine 5 MG tablet Commonly known as: NORVASC Take 5 mg by mouth daily.   glucagon 1 MG injection Glucagon Emergency Kit (human-recomb) 1 mg solution for injection   HumaLOG 100 UNIT/ML injection Generic drug: insulin lispro For use in pump, total of 55 units per day   insulin glargine-yfgn 100 UNIT/ML injection Commonly known as: Semglee (yfgn) Inject 0.12 mLs (12 Units total) into the skin 2 (two) times daily.   lisinopril 10 MG tablet Commonly known as: ZESTRIL Take 20 mg by mouth daily.   methocarbamol 500 MG tablet Commonly known as: ROBAXIN Take by mouth.   Paradigm Silhouette Full 43" Misc 1 Device by Does not apply route every 3 (three) days.   PARADIGM RESERVOIR 3ML Misc 1 Device by Does not apply route every 3 (three) days.         ALLERGIES: Allergies  Allergen Reactions   Levofloxacin Other (See Comments)    Severe Tendonitis     Lyumjev [Insulin Lispro]     Skin reaction    Sulfa Antibiotics Rash       OBJECTIVE:   VITAL SIGNS: BP 136/82 (BP Location: Left Arm, Patient Position: Sitting, Cuff Size: Small)   Pulse 70   Ht '5\' 10"'  (1.778 m)   Wt 193 lb 12.8 oz (87.9 kg)   LMP  (LMP Unknown)   SpO2 96%   BMI 27.81 kg/m    PHYSICAL EXAM:  General: Pt appears well and is in NAD  Neck: General: Supple without adenopathy or carotid bruits. Thyroid: Thyroid size normal.  No goiter or nodules appreciated.   Lungs: Clear with good BS bilat with no rales, rhonchi, or wheezes  Heart: RRR with normal S1 and S2 and no gallops; no murmurs; no rub  Abdomen: Normoactive bowel sounds, soft, nontender, without masses or organomegaly palpable  Extremities:  Lower extremities - No pretibial edema. No lesions.  Skin: Normal texture and temperature to palpation. No rash noted.   Neuro: MS is good with appropriate affect, pt is alert and Ox3    DM foot exam: 10/22/2021  The skin of the feet is intact without  sores or ulcerations. The pedal pulses are 2+ on right and 2+ on left. The sensation is intact to a screening 5.07, 10 gram monofilament bilaterally    DATA REVIEWED:  Lab Results  Component Value Date   HGBA1C 6.9 (A) 06/26/2022   HGBA1C 6.9 (A) 02/20/2022   HGBA1C 7.3 (A) 10/22/2021   05/23/2021 Tg 66 HDL 70 LDL 80 TSH 1.630 MA/Cr ratio 4 Cr 0.68 BUN 14 GFR 109   ASSESSMENT / PLAN / RECOMMENDATIONS:   1) Type 1 Diabetes Mellitus, Optimally controlled, Without complications - Most recent A1c of  6.9 %. Goal A1c < 7.0 %.    -A1c remains at goal - She is satisfied with the Tandem pump  - I have reviewed pump download with fluctuating BG's -I am not going to make any changes as the patient has a habit of self adjusting her on pump settings     MEDICATIONS: Humalog       Pump   Tandem  Settings  CF  Carb ratio Targer BG  Insulin type   Humalog      Basal rate          0000 0.900 u/h  65 11 90   0200 0825 65 11 90   0430 1.000 65 13 90   1100 0.850 65 13 90   1300 0.850 65 13 90   1400 0.770 65 13 90   1800 1.200 65 11 90   2100 1.200 65 11 90                       EDUCATION / INSTRUCTIONS: BG monitoring instructions: Patient is instructed to check her blood sugars 3 times a day, before meals . Call [facility] if: BG persistently < 70  I reviewed the Rule of 15 for the treatment of hypoglycemia in detail with the patient. Literature supplied.   2) Diabetic complications:  Eye: Does not have known diabetic retinopathy.  Neuro/ Feet: Does not have known diabetic peripheral neuropathy. Renal: Patient does not have known baseline CKD. She is  on an ACEI/ARB at present.   3) At Risk for CVD:  - She was on rosuvastatin 5 mg daily but that caused severe joint aches and pain.  - She understands the cardiovascular benefits of statin   4) Weight gain :   - Pt unable to lose weight despite lifestyle changes  - Will refer to weight  loss clinic    5) Premature manopause:  -Patient was diagnosed with premature menopause at age 71 through gynecology based on hormonal levels.  She initially did asked to repeat hormone levels but I did explain to the patient that once or when goes to menopause it is unlikely that her hormone levels are going to improve -She was offered HRT through her gynecologist but she does not want to take hormones -I have advised the patient to start a multivitamin for bone health since she is not interested in HRT   F/U in 6 months    Signed electronically by: Mack Guise, MD  [facility]  [address] [place], [place] [address] Phone: [phone] FAX: [phone]   CC: Jettie Booze, NP [place] [address] [place] [address] Phone: [phone]  Fax: [phone]    Return to Endocrinology clinic as below: No future appointments.

## 2022-07-15 DIAGNOSIS — E1065 Type 1 diabetes mellitus with hyperglycemia: Secondary | ICD-10-CM | POA: Diagnosis not present

## 2022-07-17 DIAGNOSIS — Z01419 Encounter for gynecological examination (general) (routine) without abnormal findings: Secondary | ICD-10-CM | POA: Diagnosis not present

## 2022-07-17 DIAGNOSIS — Z1151 Encounter for screening for human papillomavirus (HPV): Secondary | ICD-10-CM | POA: Diagnosis not present

## 2022-07-17 DIAGNOSIS — Z1231 Encounter for screening mammogram for malignant neoplasm of breast: Secondary | ICD-10-CM | POA: Diagnosis not present

## 2022-07-17 DIAGNOSIS — Z124 Encounter for screening for malignant neoplasm of cervix: Secondary | ICD-10-CM | POA: Diagnosis not present

## 2022-07-17 DIAGNOSIS — Z6827 Body mass index (BMI) 27.0-27.9, adult: Secondary | ICD-10-CM | POA: Diagnosis not present

## 2022-08-09 DIAGNOSIS — R519 Headache, unspecified: Secondary | ICD-10-CM | POA: Diagnosis not present

## 2022-08-09 DIAGNOSIS — R109 Unspecified abdominal pain: Secondary | ICD-10-CM | POA: Diagnosis not present

## 2022-08-09 DIAGNOSIS — Z20822 Contact with and (suspected) exposure to covid-19: Secondary | ICD-10-CM | POA: Diagnosis not present

## 2022-08-09 DIAGNOSIS — R059 Cough, unspecified: Secondary | ICD-10-CM | POA: Diagnosis not present

## 2022-08-12 DIAGNOSIS — R42 Dizziness and giddiness: Secondary | ICD-10-CM | POA: Diagnosis not present

## 2022-08-12 DIAGNOSIS — R0981 Nasal congestion: Secondary | ICD-10-CM | POA: Diagnosis not present

## 2022-08-12 DIAGNOSIS — U071 COVID-19: Secondary | ICD-10-CM | POA: Diagnosis not present

## 2022-08-19 DIAGNOSIS — E109 Type 1 diabetes mellitus without complications: Secondary | ICD-10-CM | POA: Diagnosis not present

## 2022-08-19 DIAGNOSIS — J0110 Acute frontal sinusitis, unspecified: Secondary | ICD-10-CM | POA: Diagnosis not present

## 2022-08-19 DIAGNOSIS — U071 COVID-19: Secondary | ICD-10-CM | POA: Diagnosis not present

## 2022-09-17 DIAGNOSIS — K1321 Leukoplakia of oral mucosa, including tongue: Secondary | ICD-10-CM | POA: Diagnosis not present

## 2022-09-19 DIAGNOSIS — K1370 Unspecified lesions of oral mucosa: Secondary | ICD-10-CM | POA: Diagnosis not present

## 2022-09-27 DIAGNOSIS — K1370 Unspecified lesions of oral mucosa: Secondary | ICD-10-CM | POA: Diagnosis not present

## 2022-10-02 DIAGNOSIS — K219 Gastro-esophageal reflux disease without esophagitis: Secondary | ICD-10-CM | POA: Diagnosis not present

## 2022-10-02 DIAGNOSIS — Z1211 Encounter for screening for malignant neoplasm of colon: Secondary | ICD-10-CM | POA: Diagnosis not present

## 2022-10-02 DIAGNOSIS — K121 Other forms of stomatitis: Secondary | ICD-10-CM | POA: Diagnosis not present

## 2022-10-02 DIAGNOSIS — K5900 Constipation, unspecified: Secondary | ICD-10-CM | POA: Diagnosis not present

## 2022-10-14 DIAGNOSIS — E1065 Type 1 diabetes mellitus with hyperglycemia: Secondary | ICD-10-CM | POA: Diagnosis not present

## 2022-10-17 DIAGNOSIS — L439 Lichen planus, unspecified: Secondary | ICD-10-CM | POA: Diagnosis not present

## 2022-10-17 DIAGNOSIS — L300 Nummular dermatitis: Secondary | ICD-10-CM | POA: Diagnosis not present

## 2022-11-13 ENCOUNTER — Encounter: Payer: Self-pay | Admitting: Internal Medicine

## 2022-11-13 ENCOUNTER — Ambulatory Visit: Payer: BC Managed Care – PPO | Admitting: Internal Medicine

## 2022-11-13 VITALS — BP 126/74 | HR 80 | Ht 70.0 in | Wt 195.0 lb

## 2022-11-13 DIAGNOSIS — E109 Type 1 diabetes mellitus without complications: Secondary | ICD-10-CM | POA: Diagnosis not present

## 2022-11-13 LAB — POCT GLYCOSYLATED HEMOGLOBIN (HGB A1C): Hemoglobin A1C: 6.5 % — AB (ref 4.0–5.6)

## 2022-11-13 NOTE — Patient Instructions (Addendum)
  While on oral steroids :  Change insulin to carb ratio by half , so instead of 10  change it to 5 etc . Also change your correction by from 60 to 40 .    -HOW TO TREAT LOW BLOOD SUGARS (Blood sugar LESS THAN 70 MG/DL) Please follow the RULE OF 15 for the treatment of hypoglycemia treatment (when your (blood sugars are less than 70 mg/dL)   STEP 1: Take 15 grams of carbohydrates when your blood sugar is low, which includes:  3-4 GLUCOSE TABS  OR 3-4 OZ OF JUICE OR REGULAR SODA OR ONE TUBE OF GLUCOSE GEL    STEP 2: RECHECK blood sugar in 15 MINUTES STEP 3: If your blood sugar is still low at the 15 minute recheck --> then, go back to STEP 1 and treat AGAIN with another 15 grams of carbohydrates.

## 2022-11-13 NOTE — Progress Notes (Signed)
Name: Susan Watkins  MRN/ DOB: [id number], July 23, 1976   Age/ Sex: 46 y.o., female    PCP: Jettie Booze, NP   Reason for Endocrinology Evaluation: Type 1 Diabetes Mellitus     Date of Initial Endocrinology Visit: 10/22/2021    PATIENT IDENTIFIER: Susan Watkins is a 46 y.o. female with a past medical history of T1DM and Dyslipidemia. The patient presented for initial endocrinology clinic visit on 10/22/2021 for consultative assistance with her diabetes management.    HPI: Ms. Dalporto was    Diagnosed with DM at age 75 Prior Medications tried/Intolerance: Skin reaction to Lyumjev. On the Medtronic Pump . Has been on the medtronic since the age 9, switched to Tandem 02/2022             Hemoglobin A1c has ranged from 6.7%  in 07/2021, peaking at 6.9% in 2019.   Has musculoskeletal issues  Has a 46 yr old boy, had pre-eclampsia   Transferred care from Dr. Hartford Poli    MENOPAUSE: She was diagnosed with early menopause through her 74 at age 25, was offered HRT but did not want to continue taking them. She was advised to take MVI for bone health 06/2022    SUBJECTIVE:   During the last visit (06/26/2022): A1c 6.9 %  Today (11/13/22): Mr. Oloughlin   is here for a follow up on diabetes management. She checks her blood sugars multiple  times daily. The patient has  had hypoglycemic episodes since the last clinic visit.    She has been following with Dr. Izora Gala for lichen planus on topical steroid paste She also has a pruritic rash on both shins, she is on corticosteroid cream as well as tacrolimus  Denies nausea, vomiting or diarrhea    This patient with type 1 diabetes is treated with Medtronic (insulin pump). During the visit the pump basal and bolus doses were reviewed including carb/insulin rations and supplemental doses. The clinical list was updated. The glucose meter download was reviewed in detail to determine if the current pump settings are providing the best glycemic  control without excessive hypoglycemia.  Pump and meter download:    Pump   Tandem  Settings  CF  Carb ratio Targer BG  Insulin type   Humalog      Basal rate          0000 0.900 u/h  60 11 100   0200 0.825 60 11 100   0430 1.200 60 11 100   0500 1.300 60 11 100   0630 1.00 60 12 100   1100 1.100 60 10 100   1400 1.00 60 10 100   1800 1.100 60 10 90   2130 1.300 60 10 90                          Type & Model of Pump: Tandem Insulin Type: Currently using Humalog .      CURRENT PUMP STATISTICS: Average BG:161 Average Daily Carbs (g): 129 Average Total Daily Insulin:42.39 Average Daily Bolus:  26.28 units (62%) Average Daily Basal: 16.11 units (38%)     CONTINUOUS GLUCOSE MONITORING RECORD INTERPRETATION    Dates of Recording: 11/23-12/05/2022  Sensor description:dexcom  Results statistics:   CGM use % of time 92  Average and SD 161/35  Time in range     73   %  % Time Above 180 21  % Time above 250 5  % Time Below  target 1   Glycemic patterns summary: BG is optimal overnight, noted with hypoglycemia during the day  Hyperglycemic episodes postprandial  Hypoglycemic episodes occurred early morning between 2-5 AM  Overnight periods: Trends down          HOME DIABETES REGIMEN: Humalog    Statin: Crestor caused joint pains  ACE-I/ARB: no Prior Diabetic Education: yes   DIABETIC COMPLICATIONS: Microvascular complications:   Denies: neuropathy, CKD , neuropathy Last eye exam: Completed 2022  Macrovascular complications:   Denies: CAD, PVD, CVA   PAST HISTORY: Past Medical History:  Past Medical History:  Diagnosis Date   Diabetes mellitus without complication ([place])    FH: juvenile onset diabetes mellitus    Lumbar scoliosis    Thoracic scoliosis    Past Surgical History:  Past Surgical History:  Procedure Laterality Date   CERVICAL DISC SURGERY     CESAREAN SECTION      Social History:  reports that she has never  smoked. She has never used smokeless tobacco. She reports current alcohol use. She reports that she does not use drugs. Family History:  Family History  Problem Relation Age of Onset   Diabetes Neg Hx      HOME MEDICATIONS: Allergies as of 11/13/2022       Reactions   Levofloxacin Other (See Comments)   Severe Tendonitis    Lyumjev [insulin Lispro]    Skin reaction    Sulfa Antibiotics Rash        Medication List        Accurate as of November 13, 2022  8:55 AM. If you have any questions, ask your nurse or doctor.          STOP taking these medications    methocarbamol 500 MG tablet Commonly known as: ROBAXIN Stopped by: Dorita Sciara, MD       TAKE these medications    amLODipine 5 MG tablet Commonly known as: NORVASC Take 5 mg by mouth daily.   glucagon 1 MG injection Glucagon Emergency Kit (human-recomb) 1 mg solution for injection   HumaLOG 100 UNIT/ML injection Generic drug: insulin lispro For use in pump, total of 55 units per day   insulin glargine-yfgn 100 UNIT/ML injection Commonly known as: Semglee (yfgn) Inject 0.12 mLs (12 Units total) into the skin 2 (two) times daily.   lisinopril 10 MG tablet Commonly known as: ZESTRIL Take 20 mg by mouth daily.   Paradigm Silhouette Full 43" Misc 1 Device by Does not apply route every 3 (three) days.   PARADIGM RESERVOIR 3ML Misc 1 Device by Does not apply route every 3 (three) days.   tacrolimus 0.1 % ointment Commonly known as: PROTOPIC Apply to affected area daily   triamcinolone 0.1 % paste Commonly known as: KENALOG 2 (two) times daily.         ALLERGIES: Allergies  Allergen Reactions   Levofloxacin Other (See Comments)    Severe Tendonitis     Lyumjev [Insulin Lispro]     Skin reaction    Sulfa Antibiotics Rash       OBJECTIVE:   VITAL SIGNS: BP 126/74 (BP Location: Left Arm, Patient Position: Sitting, Cuff Size: Small)   Pulse 80   Ht _0  (1.778 m)   Wt  195 lb (88.5 kg)   LMP  (LMP Unknown)   SpO2 96%   BMI 27.98 kg/m    PHYSICAL EXAM:  General: Pt appears well and is in NAD  Neck: General: Supple without adenopathy  or carotid bruits. Thyroid: Thyroid size normal.  No goiter or nodules appreciated.   Lungs: Clear with good BS bilat   Heart: RRR   Abdomen: Normoactive bowel sounds, soft, nontender, without masses or organomegaly palpable  Extremities:  Lower extremities - No pretibial edema. No lesions.  Neuro: MS is good with appropriate affect, pt is alert and Ox3    DM foot exam: 11/13/2022  The skin of the feet is intact without sores or ulcerations. The pedal pulses are 2+ on right and 2+ on left. The sensation is intact to a screening 5.07, 10 gram monofilament bilaterally    DATA REVIEWED:  Lab Results  Component Value Date   HGBA1C 6.5 (A) 11/13/2022   HGBA1C 6.9 (A) 06/26/2022   HGBA1C 6.9 (A) 02/20/2022   05/23/2021 Tg 66 HDL 70 LDL 80 TSH 1.630 MA/Cr ratio 4 Cr 0.68 BUN 14 GFR 109   ASSESSMENT / PLAN / RECOMMENDATIONS:   1) Type 1 Diabetes Mellitus, Optimally controlled, Without complications - Most recent A1c of  6.5 %. Goal A1c < 7.0 %.     -A1c due appears optimal but she continues with glycemic excursions ranging from 60s to ~ 300 mg/DL - She has made self changes to her pump by adding a new basal rate  at 5 am and 630 am , she also changed her SF across the board from 65 to 60 and changed her I:C ratio  -She has been noted with hypoglycemia after midnight, but this is following a correction bolus -She also tends to have hypoglycemic episode between 4-5 AM every day, will remove 5 AM new basal slide -I discussed with the patient that she is right now on too much basal given that 62% of her total daily dose is basal and only 38 is bolus.  Typically with type 1 diabetes these figures should be switched -She is having no response to topical treatment for lichen planus, consider an oral glucocorticoids,  I have advised the patient that if she is going to be on oral glucocorticoids she should leave the basal rate alone at this time, but insulin to carb ratio by 50% ( e.g 10 to 5) and also change correction factor from 60 to 40 -I am also going to change her sensitivity factor at midnight to prevent hypoglycemia after correction bolus -71% of the time she is manually bolusing and only 29% is 2 IQ control technology    MEDICATIONS: Humalog       Pump   Tandem  Settings  CF  Carb ratio Targer BG  Insulin type   Humalog      Basal rate          0000 0.900 u/h  65 11 100   0200 0.825 60 11 100   0430 1.200 60 10 100   0630 1.00 60 10 100   1100 1.100 60 9 100   1400 1.00 60 9 100   1800 1.100 60 9 90   2130 1.300 60 9 90                       EDUCATION / INSTRUCTIONS: BG monitoring instructions: Patient is instructed to check her blood sugars 3 times a day, before meals . Call [facility] if: BG persistently < 70  I reviewed the Rule of 15 for the treatment of hypoglycemia in detail with the patient. Literature supplied.   2) Diabetic complications:  Eye: Does not have known  diabetic retinopathy.  Neuro/ Feet: Does not have known diabetic peripheral neuropathy. Renal: Patient does not have known baseline CKD. She is  on an ACEI/ARB at present.   3) Weight Gain:  -She was not excepted to the healthy weight and wellness clinic as her BMI was less than 30 -She was advised to either follow weight watchers or [facility]  F/U in 6 months    Signed electronically by: Mack Guise, MD  [facility]  [address] [place], [place] [address] Phone: [phone] FAX: [phone]   CC: Jettie Booze, NP [place] [address] [place] [address] Phone: [phone]  Fax: [phone]    Return to Endocrinology clinic as below: No future appointments.

## 2022-11-14 DIAGNOSIS — L439 Lichen planus, unspecified: Secondary | ICD-10-CM | POA: Diagnosis not present

## 2022-11-28 DIAGNOSIS — L439 Lichen planus, unspecified: Secondary | ICD-10-CM | POA: Diagnosis not present

## 2022-11-28 DIAGNOSIS — D485 Neoplasm of uncertain behavior of skin: Secondary | ICD-10-CM | POA: Diagnosis not present

## 2022-11-28 DIAGNOSIS — L578 Other skin changes due to chronic exposure to nonionizing radiation: Secondary | ICD-10-CM | POA: Diagnosis not present

## 2022-11-28 DIAGNOSIS — L300 Nummular dermatitis: Secondary | ICD-10-CM | POA: Diagnosis not present

## 2022-12-12 ENCOUNTER — Encounter: Payer: Self-pay | Admitting: Internal Medicine

## 2022-12-12 MED ORDER — DEXCOM G7 SENSOR MISC: 1.0000 | 3 refills | Status: DC

## 2022-12-15 ENCOUNTER — Other Ambulatory Visit: Payer: Self-pay | Admitting: Internal Medicine

## 2022-12-16 ENCOUNTER — Encounter: Payer: Self-pay | Admitting: Internal Medicine

## 2022-12-16 ENCOUNTER — Other Ambulatory Visit: Payer: Self-pay

## 2022-12-16 MED ORDER — INSULIN LISPRO 100 UNIT/ML IJ SOLN: INTRAMUSCULAR | 0 refills | Status: DC

## 2022-12-16 MED ORDER — INSULIN LISPRO 100 UNIT/ML IJ SOLN: INTRAMUSCULAR | 3 refills | Status: DC

## 2022-12-18 DIAGNOSIS — L438 Other lichen planus: Secondary | ICD-10-CM | POA: Diagnosis not present

## 2022-12-18 DIAGNOSIS — Z13220 Encounter for screening for lipoid disorders: Secondary | ICD-10-CM | POA: Diagnosis not present

## 2022-12-18 DIAGNOSIS — E109 Type 1 diabetes mellitus without complications: Secondary | ICD-10-CM | POA: Diagnosis not present

## 2022-12-18 DIAGNOSIS — I152 Hypertension secondary to endocrine disorders: Secondary | ICD-10-CM | POA: Diagnosis not present

## 2022-12-18 DIAGNOSIS — Z23 Encounter for immunization: Secondary | ICD-10-CM | POA: Diagnosis not present

## 2022-12-18 DIAGNOSIS — E1059 Type 1 diabetes mellitus with other circulatory complications: Secondary | ICD-10-CM | POA: Diagnosis not present

## 2023-01-04 ENCOUNTER — Encounter: Payer: Self-pay | Admitting: Internal Medicine

## 2023-01-08 DIAGNOSIS — E109 Type 1 diabetes mellitus without complications: Secondary | ICD-10-CM | POA: Diagnosis not present

## 2023-01-08 DIAGNOSIS — H43823 Vitreomacular adhesion, bilateral: Secondary | ICD-10-CM | POA: Diagnosis not present

## 2023-01-08 DIAGNOSIS — H2513 Age-related nuclear cataract, bilateral: Secondary | ICD-10-CM | POA: Diagnosis not present

## 2023-01-08 LAB — HM DIABETES EYE EXAM

## 2023-01-09 ENCOUNTER — Encounter: Payer: Self-pay | Admitting: Internal Medicine

## 2023-01-13 DIAGNOSIS — E109 Type 1 diabetes mellitus without complications: Secondary | ICD-10-CM | POA: Diagnosis not present

## 2023-01-16 DIAGNOSIS — L439 Lichen planus, unspecified: Secondary | ICD-10-CM | POA: Diagnosis not present

## 2023-01-20 DIAGNOSIS — L439 Lichen planus, unspecified: Secondary | ICD-10-CM | POA: Diagnosis not present

## 2023-02-06 ENCOUNTER — Ambulatory Visit (HOSPITAL_BASED_OUTPATIENT_CLINIC_OR_DEPARTMENT_OTHER): Payer: BC Managed Care – PPO | Admitting: Cardiology

## 2023-02-06 ENCOUNTER — Encounter (HOSPITAL_BASED_OUTPATIENT_CLINIC_OR_DEPARTMENT_OTHER): Payer: Self-pay | Admitting: Cardiology

## 2023-02-06 VITALS — BP 128/82 | HR 66 | Ht 70.0 in | Wt 198.4 lb

## 2023-02-06 DIAGNOSIS — I10 Essential (primary) hypertension: Secondary | ICD-10-CM | POA: Diagnosis not present

## 2023-02-06 DIAGNOSIS — M791 Myalgia, unspecified site: Secondary | ICD-10-CM

## 2023-02-06 DIAGNOSIS — R002 Palpitations: Secondary | ICD-10-CM

## 2023-02-06 DIAGNOSIS — Z7189 Other specified counseling: Secondary | ICD-10-CM

## 2023-02-06 DIAGNOSIS — E109 Type 1 diabetes mellitus without complications: Secondary | ICD-10-CM

## 2023-02-06 DIAGNOSIS — T466X5A Adverse effect of antihyperlipidemic and antiarteriosclerotic drugs, initial encounter: Secondary | ICD-10-CM

## 2023-02-06 NOTE — Progress Notes (Signed)
Cardiology Office Note:    Date:  02/06/2023   ID:  Susan Watkins, DOB 09-24-1976, MRN [id number]  PCP:  Curly Rim, MD  Cardiologist:  Buford Dresser, MD  Referring MD: Jettie Booze, NP   CC:  Follow-up  History of Present Illness:    Susan Watkins is a 48 y.o. female with a hx of type I diabetes, hypertension, family history of TTR amyloid (father) who is seen for follow-up today. She was initially seen 09/14/2020 as a new consult at the request of White, Delmar Landau, NP for the evaluation and management of palpitations.  History: Works as a Music therapist at [employer]. She has longstanding type I diabetes since 1987. Has been on antihypertensives since age 1. Experienced premature menopause. Had myalgias on statins. History of pre-eclampsia and emergency c-section.  -Prior workup: distant stress test, normal (~2007) at [facility], echo 2018, echo stress 2012 -Comorbidities: type I diabetes, hypertension, history of pre-eclampsia, early menopause -Family history: father has cardiac amyloidosis (TTR), mom has afib. Grandmother just passed at age 76 with afib, heart failure.  Lipids from 11/22/19 showed Tchol 169, TG 61, HDL 87, LDL 70.   At her last visit we discussed statin therapy. She was amenable to starting low dose rosuvastatin. No plans for childbearing. She wore a 7 day Zio. Isolated atrial and ventricular ectopy was rare (<1%). There were 18 triggered events correlating with sinus rhythm/sinus tachycardia. No VT, SVT, atrial fibrillation, high degree block, or pauses.   She had a coronary CT 03/08/2022 which showed a coronary calcium score of 0.  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 She had a biopsy of her right forearm and is concerned about her mouth lesions.  She complains of similar palpitations as when I initially met her. She describes having occasional fluttering that is stable. Nonlimiting symptoms, not  worsening.  She endorses swelling in her feet, ongoing for years.  For activity, she "never sits down." Exercises 30 minutes a day and completes a lot of landscaping and house work.  Her diabetes is reportedly stable and controlled. Since September she has tried Weight Watchers but has gained some weight. She follows a consistent diet with salads for lunch.  She denies any chest pain, shortness of breath, lightheadedness, headaches, syncope, orthopnea, or PND.   Past Medical History:  Diagnosis Date   Diabetes mellitus without complication ([place])    FH: juvenile onset diabetes mellitus    Lumbar scoliosis    Thoracic scoliosis     Past Surgical History:  Procedure Laterality Date   CERVICAL DISC SURGERY     CESAREAN SECTION      Current Medications: Current Outpatient Medications on File Prior to Visit  Medication Sig   Continuous Blood Gluc Sensor (DEXCOM G7 SENSOR) MISC 1 Device by Does not apply route as directed.   glucagon 1 MG injection Glucagon Emergency Kit (human-recomb) 1 mg solution for injection   Insulin Infusion Pump Supplies (PARADIGM RESERVOIR 3ML) MISC 1 Device by Does not apply route every 3 (three) days.   Insulin Infusion Pump Supplies (PARADIGM SILHOUETTE FULL 43") MISC 1 Device by Does not apply route every 3 (three) days.   insulin lispro (HUMALOG) 100 UNIT/ML injection Max daily 90 units per pump   lisinopril (PRINIVIL,ZESTRIL) 10 MG tablet Take 20 mg by mouth daily.   tacrolimus (PROTOPIC) 0.1 % ointment Apply to affected area daily   triamcinolone (KENALOG) 0.1 % paste 2 (two) times daily.  amLODipine (NORVASC) 5 MG tablet Take 5 mg by mouth daily. (Patient not taking: Reported on 02/06/2023)   insulin glargine-yfgn (SEMGLEE, YFGN,) 100 UNIT/ML injection Inject 0.12 mLs (12 Units total) into the skin 2 (two) times daily. (Patient not taking: Reported on 02/06/2023)   No current facility-administered medications on file prior to visit.     Allergies:    Levofloxacin, Lyumjev [insulin lispro], and Sulfa antibiotics   Social History   Tobacco Use   Smoking status: Never   Smokeless tobacco: Never  Substance Use Topics   Alcohol use: Yes    Comment: occasional   Drug use: No    Family History: family history is negative for Diabetes.  ROS:   Please see the history of present illness. (+) Palpitations (+) Arthralgias All other systems are reviewed and negative.    EKGs/Labs/Other Studies Reviewed:    The following studies were reviewed today:  CT Cardiac Scoring  03/08/2022  [facility]): Cardiac anatomy:  Normal heart size, no evidence of pericardial effusion  Mediastinum: No adenopathy  Visible abdomen: No significant abnormality  Visible lung fields: Visible portions of the lungs are clear   IMPRESSION: No detectable calcified coronary artery plaque.   Monitor  10/2020: 7 days of data recorded on Zio monitor. Patient had a min HR of 47 bpm, max HR of 151 bpm, and avg HR of 79 bpm. Predominant underlying rhythm was Sinus Rhythm. No VT, SVT, atrial fibrillation, high degree block, or pauses noted. Isolated atrial and ventricular ectopy was rare (<1%). There were 18 triggered events. These were sinus rhythm/sinus tachycardia. No significant arrhythmias detected.   Can see history of studies in Care Everywhere but not full results  EKG:  EKG is personally reviewed.   02/06/2023:  NSR at 66 bpm 09/14/2020:  NSR at 80 bpm  Recent Labs: No results found for requested labs within last 365 days.   Recent Lipid Panel No results found for: "CHOL", "TRIG", "HDL", "CHOLHDL", "VLDL", "LDLCALC", "LDLDIRECT"  Physical Exam:    VS:  BP 128/82 (BP Location: Left Arm, Patient Position: Sitting, Cuff Size: Normal)   Pulse 66   Ht '5\' 10"'$  (1.778 m)   Wt 198 lb 6.4 oz (90 kg)   LMP  (LMP Unknown)   BMI 28.47 kg/m     Wt Readings from Last 3 Encounters:  02/06/23 198 lb 6.4 oz (90 kg)  11/13/22 195 lb (88.5 kg)  06/26/22 193 lb  12.8 oz (87.9 kg)    GEN: Well nourished, well developed in no acute distress HEENT: Normal, moist mucous membranes NECK: No JVD CARDIAC: regular rhythm, normal S1 and S2, no rubs or gallops. No murmurs. VASCULAR: Radial and DP pulses 2+ bilaterally. No carotid bruits RESPIRATORY:  Clear to auscultation without rales, wheezing or rhonchi  ABDOMEN: Soft, non-tender, non-distended MUSCULOSKELETAL:  Ambulates independently SKIN: Warm and dry, Trace BLE edema NEUROLOGIC:  Alert and oriented x 3. No focal neuro deficits noted. PSYCHIATRIC:  Normal affect    ASSESSMENT:    1. Heart palpitations   2. Essential hypertension   3. Type 1 diabetes mellitus without complication (HCC)   4. Cardiac risk counseling   5. Myalgia due to statin     PLAN:    Palpitations: -see prior monitor -symptoms stable, no high risk features/symptoms to this point  At increased risk for CVD: with longstanding type 1 diabetes -guidelines recommend statin no matter baseline LDL for patients >40 with at least one risk factor (HTN) -no plans for  childbearing, going through premature menopause now -trialed low dose rosuvastatin, but had myalgias -calcium score 0, but this does not show possible fatty plaque -after shared decision making, she would like to not retrial statins now, would be amenable to non-statin therapies in the future.  Hypertension: -generally well controlled -continue lisinopril 20 mg daily  Cardiac risk counseling and prevention recommendations: -recommend heart healthy/Mediterranean diet, with whole grains, fruits, vegetable, fish, lean meats, nuts, and olive oil. Limit salt. -recommend moderate walking, 3-5 times/week for 30-50 minutes each session. Aim for at least 150 minutes.week. Goal should be pace of 3 miles/hours, or walking 1.5 miles in 30 minutes -recommend avoidance of tobacco products. Avoid excess alcohol. -ASCVD risk score: The 10-year ASCVD risk score (Arnett DK, et al.,  2019) is: 1.4%   Values used to calculate the score:     Age: 71 years     Sex: Female     Is Non-Hispanic African American: No     Diabetic: Yes     Tobacco smoker: No     Systolic Blood Pressure: 0000000 mmHg     Is BP treated: Yes     HDL Cholesterol: 87 mg/dL     Total Cholesterol: 225 mg/dL    Plan for follow up: 1-2 years or sooner as needed.  Buford Dresser, MD, PhD [place]  [facility]    Medication Adjustments/Labs and Tests Ordered: Current medicines are reviewed at length with the patient today.  Concerns regarding medicines are outlined above.   Orders Placed This Encounter  Procedures   EKG 12-Lead   No orders of the defined types were placed in this encounter.  Patient Instructions  Medication Instructions:  Your physician recommends that you continue on your current medications as directed. Please refer to the Current Medication list given to you today.  *If you need a refill on your cardiac medications before your next appointment, please call your pharmacy*  Lab Work: NONE  Testing/Procedures: NONE  Follow-Up: At [facility], you and your health needs are our priority.  As part of our continuing mission to provide you with exceptional heart care, we have created designated Provider Care Teams.  These Care Teams include your primary Cardiologist (physician) and Advanced Practice Providers (APPs -  Physician Assistants and Nurse Practitioners) who all work together to provide you with the care you need, when you need it.  We recommend signing up for the patient portal called "MyChart".  Sign up information is provided on this After Visit Summary.  MyChart is used to connect with patients for Virtual Visits (Telemedicine).  Patients are able to view lab/test results, encounter notes, upcoming appointments, etc.  Non-urgent messages can be sent to your provider as well.   To learn more about what you can do with MyChart, go to  [email or web address].    Your next appointment:   12 month(s)  The format for your next appointment:   In Person  Provider:   Buford Dresser, MD       [facility],acting as a scribe for Buford Dresser, MD.,have documented all relevant documentation on the behalf of Buford Dresser, MD,as directed by  Buford Dresser, MD while in the presence of Buford Dresser, MD.  I, Buford Dresser, MD, have reviewed all documentation for this visit. The documentation on 02/06/23 for the exam, diagnosis, procedures, and orders are all accurate and complete.   Signed, Buford Dresser, MD PhD 02/06/2023 7:48 PM    [place]

## 2023-02-06 NOTE — Patient Instructions (Signed)
Medication Instructions:  Your physician recommends that you continue on your current medications as directed. Please refer to the Current Medication list given to you today.  *If you need a refill on your cardiac medications before your next appointment, please call your pharmacy*  Lab Work: NONE  Testing/Procedures: NONE  Follow-Up: At [facility], you and your health needs are our priority.  As part of our continuing mission to provide you with exceptional heart care, we have created designated Provider Care Teams.  These Care Teams include your primary Cardiologist (physician) and Advanced Practice Providers (APPs -  Physician Assistants and Nurse Practitioners) who all work together to provide you with the care you need, when you need it.  We recommend signing up for the patient portal called "MyChart".  Sign up information is provided on this After Visit Summary.  MyChart is used to connect with patients for Virtual Visits (Telemedicine).  Patients are able to view lab/test results, encounter notes, upcoming appointments, etc.  Non-urgent messages can be sent to your provider as well.   To learn more about what you can do with MyChart, go to [email or web address].    Your next appointment:   12 month(s)  The format for your next appointment:   In Person  Provider:   Bridgette Christopher, MD     

## 2023-02-12 DIAGNOSIS — D599 Acquired hemolytic anemia, unspecified: Secondary | ICD-10-CM | POA: Diagnosis not present

## 2023-02-12 DIAGNOSIS — L439 Lichen planus, unspecified: Secondary | ICD-10-CM | POA: Diagnosis not present

## 2023-02-26 DIAGNOSIS — L209 Atopic dermatitis, unspecified: Secondary | ICD-10-CM | POA: Diagnosis not present

## 2023-02-26 DIAGNOSIS — L439 Lichen planus, unspecified: Secondary | ICD-10-CM | POA: Diagnosis not present

## 2023-02-26 DIAGNOSIS — Z79899 Other long term (current) drug therapy: Secondary | ICD-10-CM | POA: Diagnosis not present

## 2023-02-27 DIAGNOSIS — K1321 Leukoplakia of oral mucosa, including tongue: Secondary | ICD-10-CM | POA: Diagnosis not present

## 2023-03-19 ENCOUNTER — Other Ambulatory Visit: Payer: Self-pay | Admitting: Internal Medicine

## 2023-04-16 DIAGNOSIS — R7989 Other specified abnormal findings of blood chemistry: Secondary | ICD-10-CM | POA: Diagnosis not present

## 2023-04-16 DIAGNOSIS — L439 Lichen planus, unspecified: Secondary | ICD-10-CM | POA: Diagnosis not present

## 2023-04-16 DIAGNOSIS — K1370 Unspecified lesions of oral mucosa: Secondary | ICD-10-CM | POA: Diagnosis not present

## 2023-04-22 DIAGNOSIS — E109 Type 1 diabetes mellitus without complications: Secondary | ICD-10-CM | POA: Diagnosis not present

## 2023-04-24 DIAGNOSIS — M159 Polyosteoarthritis, unspecified: Secondary | ICD-10-CM | POA: Diagnosis not present

## 2023-04-24 DIAGNOSIS — L438 Other lichen planus: Secondary | ICD-10-CM | POA: Diagnosis not present

## 2023-04-24 DIAGNOSIS — K1370 Unspecified lesions of oral mucosa: Secondary | ICD-10-CM | POA: Diagnosis not present

## 2023-04-30 ENCOUNTER — Encounter: Payer: Self-pay | Admitting: Internal Medicine

## 2023-04-30 ENCOUNTER — Ambulatory Visit: Payer: BC Managed Care – PPO | Admitting: Internal Medicine

## 2023-04-30 VITALS — BP 126/72 | HR 68 | Ht 70.0 in | Wt 193.0 lb

## 2023-04-30 DIAGNOSIS — E109 Type 1 diabetes mellitus without complications: Secondary | ICD-10-CM | POA: Diagnosis not present

## 2023-04-30 LAB — POCT GLYCOSYLATED HEMOGLOBIN (HGB A1C): Hemoglobin A1C: 6.5 % — AB (ref 4.0–5.6)

## 2023-04-30 NOTE — Progress Notes (Signed)
Name: Susan Watkins  MRN/ DOB: [id number], 1976-10-22   Age/ Sex: 47 y.o., female    PCP: [name], [name], MD   Reason for Endocrinology Evaluation: Type 1 Diabetes Mellitus     Date of Initial Endocrinology Visit: 10/22/2021    PATIENT IDENTIFIER: [name] is a 47 y.o. female with a past medical history of T1DM and Dyslipidemia. The patient presented for initial endocrinology clinic visit on 10/22/2021 for consultative assistance with her diabetes management.    HPI: [name] was    Diagnosed with DM at age 66 Prior Medications tried/Intolerance: Skin reaction to Lyumjev. On the Medtronic Pump . Has been on the medtronic since the age 16, switched to Tandem 02/2022             Hemoglobin A1c has ranged from 6.7%  in 07/2021, peaking at 6.9% in 2019.   Has musculoskeletal issues  Has a 47 yr old boy, had pre-eclampsia   Transferred care from Dr. Shawnee Knapp    MENOPAUSE: She was diagnosed with early menopause through her Gyn at age 63, was offered HRT but did not want to continue taking them. She was advised to take MVI for bone health 06/2022    SUBJECTIVE:   During the last visit (11/13/2022): A1c 6.5 %  Today (04/30/23): [name]   is here for a follow up on diabetes management. She checks her blood sugars multiple  times daily. The patient has  had hypoglycemic episodes since the last clinic visit.    She continues to follow-up with Dr. Serena Colonel for lichen planus was on topical steroid paste, s/p mouth biopsy 02/2023 confirmed histological features of lichen planus  She establish care with [facility] with arthralgias, MRI of the right hip indicated no acute osseous or soft tissue findings, but surgical changes of the right femoroplasty, negative ANA, RPR, normal CBC TFTs and CMP 04/24/2023   She also has a pruritic rash on both shins, she is on corticosteroid cream as well as tacrolimus   She was seen by cardiology 01/2023 for palpitations, coronary  calcium score of 02/2022, recommended statin therapy  She has been changing her diet and noted some improvement in her lichen planus   Denies nausea, vomiting  Denies constipation, has chronic constipation   This patient with type 1 diabetes is treated with Medtronic (insulin pump). During the visit the pump basal and bolus doses were reviewed including carb/insulin rations and supplemental doses. The clinical list was updated. The glucose meter download was reviewed in detail to determine if the current pump settings are providing the best glycemic control without excessive hypoglycemia.  Pump and meter download:  Based on her last visit these were the recommended settings   Pump   Tandem  Settings  CF  Carb ratio Targer BG  Insulin type   Humalog      Basal rate          0000 0.900 u/h  65 11 100   0200 0.825 60 11 100   0430 1.200 60 10 100   0630 1.00 60 10 100   1100 1.100 60 9 100   1400 1.00 60 9 100   1800 1.100 60 9 90   2130 1.300 60 9 90                      But on today's download here are the self made changes    Pump   Tandem  Settings  CF  Carb ratio Targer BG  Insulin type   Humalog      Basal rate          0000 1.00u/h  70 11 100   0200 0.900 70 11 100   0400 1.1 70 14 100   0830 0.850 75 14 100   1100 0.900 70 14 100   1315 0.850 80 12 100   1730 0.900 70 10 90   2030 0.900 70 11 90   2200 1.00 70 11 90                  Type & Model of Pump: Tandem Insulin Type: Currently using Humalog .      CURRENT PUMP STATISTICS: Average BG:146 Average Daily Carbs (g): 137 Average Total Daily Insulin:43.03 Average Daily Bolus: 21.36 units (50%) Average Daily Basal: 21.67 units (50%)     CONTINUOUS GLUCOSE MONITORING RECORD INTERPRETATION    Dates of Recording: 4/25-5/22/2024  Sensor description:dexcom  Results statistics:   CGM use % of time 96  Average and SD 146/52  Time in range     73   %  % Time Above 180 21  % Time above 250  3.6  % Time Below target 2   Glycemic patterns summary: BG is optimal overnight, and most of the day   Hyperglycemic episodes postprandial  Hypoglycemic episodes occurred early morning around 4 am   Overnight periods: optimal      HOME DIABETES REGIMEN: Humalog    Statin: Crestor caused joint pains  ACE-I/ARB: no Prior Diabetic Education: yes   DIABETIC COMPLICATIONS: Microvascular complications:   Denies: neuropathy, CKD , neuropathy Last eye exam: Completed 01/08/2023  Macrovascular complications:   Denies: CAD, PVD, CVA   PAST HISTORY: Past Medical History:  Past Medical History:  Diagnosis Date   Diabetes mellitus without complication (HCC)    FH: juvenile onset diabetes mellitus    Lumbar scoliosis    Thoracic scoliosis    Past Surgical History:  Past Surgical History:  Procedure Laterality Date   CERVICAL DISC SURGERY     CESAREAN SECTION      Social History:  reports that she has never smoked. She has never used smokeless tobacco. She reports current alcohol use. She reports that she does not use drugs. Family History:  Family History  Problem Relation Age of Onset   Diabetes Neg Hx      HOME MEDICATIONS: Allergies as of 04/30/2023       Reactions   Levofloxacin Other (See Comments)   Severe Tendonitis    Lyumjev [insulin Lispro]    Skin reaction    Sulfa Antibiotics Rash        Medication List        Accurate as of Apr 30, 2023  4:07 PM. If you have any questions, ask your nurse or doctor.          STOP taking these medications    amLODipine 5 MG tablet Commonly known as: NORVASC Stopped by: Scarlette Shorts, MD   insulin glargine-yfgn 100 UNIT/ML injection Commonly known as: Semglee (yfgn) Stopped by: Scarlette Shorts, MD   PARADIGM RESERVOIR Misc Stopped by: Scarlette Shorts, MD   Paradigm Silhouette Full 43" Misc Stopped by: Scarlette Shorts, MD       TAKE these medications    Dexcom G7  Sensor Misc 1 Device by Does not apply route as directed.   glucagon 1 MG injection Glucagon Emergency  Kit (human-recomb) 1 mg solution for injection   insulin lispro 100 UNIT/ML injection Commonly known as: HUMALOG INJECT 90 UNITS DAILY IN PUMP   lisinopril 10 MG tablet Commonly known as: ZESTRIL Take 20 mg by mouth daily.   tacrolimus 0.1 % ointment Commonly known as: PROTOPIC Apply to affected area daily   triamcinolone 0.1 % paste Commonly known as: KENALOG 2 (two) times daily.         ALLERGIES: Allergies  Allergen Reactions   Levofloxacin Other (See Comments)    Severe Tendonitis     Lyumjev [Insulin Lispro]     Skin reaction    Sulfa Antibiotics Rash       OBJECTIVE:   VITAL SIGNS: BP 126/72 (BP Location: Left Arm, Patient Position: Sitting, Cuff Size: Large)   Pulse 68   Ht 5\' 10"  (1.778 m)   Wt 193 lb (87.5 kg)   LMP  (LMP Unknown)   SpO2 99%   BMI 27.69 kg/m    PHYSICAL EXAM:  General: Pt appears well and is in NAD  Lungs: Clear with good BS bilat   Heart: RRR   Abdomen: soft, nontender  Extremities:  Lower extremities - No pretibial edema. Scattered papular rash over the shins  Neuro: MS is good with appropriate affect, pt is alert and Ox3    DM foot exam: 11/13/2022  The skin of the feet is intact without sores or ulcerations. The pedal pulses are 2+ on right and 2+ on left. The sensation is intact to a screening 5.07, 10 gram monofilament bilaterally    DATA REVIEWED:  Lab Results  Component Value Date   HGBA1C 6.5 (A) 04/30/2023   HGBA1C 6.5 (A) 11/13/2022   HGBA1C 6.9 (A) 06/26/2022   04/16/2023 CR 0.56 BUN 16 GFR 114  12/18/2022 TSH 1.430  ASSESSMENT / PLAN / RECOMMENDATIONS:   1) Type 1 Diabetes Mellitus, Optimally controlled, Without complications - Most recent A1c of  6.5 %. Goal A1c < 7.0 %.    -A1c remains at goal -Patient continues to self adjust pump settings -I did recommend that she reduces her basal rate  from 4 to 4:30 AM -No other changes  MEDICATIONS: Humalog    Pump   Tandem  Settings  CF  Carb ratio Targer BG  Insulin type   Humalog      Basal rate          0000 1.00u/h  70 11 100   0200 0.900 70 11 100   0430 1.1 70 14 100   0830 0.850 75 14 100   1100 0.900 70 14 100   1315 0.850 80 12 100   1730 0.900 70 10 90   2030 0.900 70 11 90   2200 1.00 70 11 90                   EDUCATION / INSTRUCTIONS: BG monitoring instructions: Patient is instructed to check her blood sugars 3 times a day, before meals . Call [facility] if: BG persistently < 70  I reviewed the Rule of 15 for the treatment of hypoglycemia in detail with the patient. Literature supplied.   2) Diabetic complications:  Eye: Does not have known diabetic retinopathy.  Neuro/ Feet: Does not have known diabetic peripheral neuropathy. Renal: Patient does not have known baseline CKD. She is  on an ACEI/ARB at present.    F/U in 6 months    Signed electronically by: Lyndle Herrlich, MD  [facility]  [facility] [address] [place], [place] [address] Phone: [phone] FAX: [phone]   CC: Vivien Presto, MD [address] [place] [address] Phone: [phone]  Fax: [phone]    Return to Endocrinology clinic as below: Future Appointments  Date Time Provider Department Center  10/29/2023 11:10 AM Evony Rezek, Konrad Dolores, MD [facility] None

## 2023-04-30 NOTE — Patient Instructions (Signed)

## 2023-05-07 ENCOUNTER — Ambulatory Visit: Payer: BC Managed Care – PPO | Admitting: Internal Medicine

## 2023-05-14 ENCOUNTER — Telehealth: Payer: Self-pay

## 2023-05-14 NOTE — Telephone Encounter (Signed)
Contact patient to reschedule appointment in November.

## 2023-05-14 NOTE — Telephone Encounter (Signed)
Patient is already scheduled for 10/29/2023 at 11:10 AM with Dr. Lonzo Cloud.

## 2023-05-16 DIAGNOSIS — R319 Hematuria, unspecified: Secondary | ICD-10-CM | POA: Diagnosis not present

## 2023-05-16 DIAGNOSIS — R300 Dysuria: Secondary | ICD-10-CM | POA: Diagnosis not present

## 2023-05-16 DIAGNOSIS — N390 Urinary tract infection, site not specified: Secondary | ICD-10-CM | POA: Diagnosis not present

## 2023-05-21 DIAGNOSIS — R319 Hematuria, unspecified: Secondary | ICD-10-CM | POA: Diagnosis not present

## 2023-05-21 DIAGNOSIS — N390 Urinary tract infection, site not specified: Secondary | ICD-10-CM | POA: Diagnosis not present

## 2023-06-20 ENCOUNTER — Encounter: Payer: Self-pay | Admitting: Internal Medicine

## 2023-06-20 ENCOUNTER — Other Ambulatory Visit: Payer: Self-pay

## 2023-06-20 MED ORDER — INSULIN GLARGINE 100 UNIT/ML ~~LOC~~ SOLN: 25.0000 [IU] | Freq: Every day | SUBCUTANEOUS | 0 refills | Status: DC

## 2023-06-20 MED ORDER — INSULIN PEN NEEDLE 32G X 4 MM MISC: 1.0000 | Freq: Every day | 1 refills | Status: AC

## 2023-06-20 MED ORDER — INSULIN GLARGINE 100 UNITS/ML SOLOSTAR PEN: 25.0000 [IU] | PEN_INJECTOR | Freq: Every day | SUBCUTANEOUS | 1 refills | Status: DC

## 2023-07-04 DIAGNOSIS — R300 Dysuria: Secondary | ICD-10-CM | POA: Diagnosis not present

## 2023-07-15 ENCOUNTER — Other Ambulatory Visit: Payer: Self-pay | Admitting: Internal Medicine

## 2023-07-22 DIAGNOSIS — E109 Type 1 diabetes mellitus without complications: Secondary | ICD-10-CM | POA: Diagnosis not present

## 2023-07-22 DIAGNOSIS — E663 Overweight: Secondary | ICD-10-CM | POA: Diagnosis not present

## 2023-07-22 DIAGNOSIS — M62838 Other muscle spasm: Secondary | ICD-10-CM | POA: Diagnosis not present

## 2023-07-23 DIAGNOSIS — E109 Type 1 diabetes mellitus without complications: Secondary | ICD-10-CM | POA: Diagnosis not present

## 2023-09-11 DIAGNOSIS — R635 Abnormal weight gain: Secondary | ICD-10-CM | POA: Diagnosis not present

## 2023-09-11 DIAGNOSIS — J0110 Acute frontal sinusitis, unspecified: Secondary | ICD-10-CM | POA: Diagnosis not present

## 2023-09-11 DIAGNOSIS — E1059 Type 1 diabetes mellitus with other circulatory complications: Secondary | ICD-10-CM | POA: Diagnosis not present

## 2023-09-11 DIAGNOSIS — I152 Hypertension secondary to endocrine disorders: Secondary | ICD-10-CM | POA: Diagnosis not present

## 2023-09-11 DIAGNOSIS — K219 Gastro-esophageal reflux disease without esophagitis: Secondary | ICD-10-CM | POA: Diagnosis not present

## 2023-09-22 ENCOUNTER — Other Ambulatory Visit: Payer: Self-pay

## 2023-10-03 DIAGNOSIS — R1909 Other intra-abdominal and pelvic swelling, mass and lump: Secondary | ICD-10-CM | POA: Diagnosis not present

## 2023-10-03 DIAGNOSIS — K5900 Constipation, unspecified: Secondary | ICD-10-CM | POA: Diagnosis not present

## 2023-10-03 DIAGNOSIS — Z6828 Body mass index (BMI) 28.0-28.9, adult: Secondary | ICD-10-CM | POA: Diagnosis not present

## 2023-10-03 DIAGNOSIS — R079 Chest pain, unspecified: Secondary | ICD-10-CM | POA: Diagnosis not present

## 2023-10-03 DIAGNOSIS — R102 Pelvic and perineal pain: Secondary | ICD-10-CM | POA: Diagnosis not present

## 2023-10-03 DIAGNOSIS — Z1211 Encounter for screening for malignant neoplasm of colon: Secondary | ICD-10-CM | POA: Diagnosis not present

## 2023-10-03 DIAGNOSIS — R635 Abnormal weight gain: Secondary | ICD-10-CM | POA: Diagnosis not present

## 2023-10-03 DIAGNOSIS — R1319 Other dysphagia: Secondary | ICD-10-CM | POA: Diagnosis not present

## 2023-10-09 ENCOUNTER — Encounter: Payer: Self-pay | Admitting: Internal Medicine

## 2023-10-09 DIAGNOSIS — R1904 Left lower quadrant abdominal swelling, mass and lump: Secondary | ICD-10-CM | POA: Diagnosis not present

## 2023-10-16 DIAGNOSIS — Z713 Dietary counseling and surveillance: Secondary | ICD-10-CM | POA: Diagnosis not present

## 2023-10-16 DIAGNOSIS — Z7182 Exercise counseling: Secondary | ICD-10-CM | POA: Diagnosis not present

## 2023-10-16 DIAGNOSIS — Z6828 Body mass index (BMI) 28.0-28.9, adult: Secondary | ICD-10-CM | POA: Diagnosis not present

## 2023-10-16 DIAGNOSIS — Z1331 Encounter for screening for depression: Secondary | ICD-10-CM | POA: Diagnosis not present

## 2023-10-16 DIAGNOSIS — E663 Overweight: Secondary | ICD-10-CM | POA: Diagnosis not present

## 2023-10-21 DIAGNOSIS — E109 Type 1 diabetes mellitus without complications: Secondary | ICD-10-CM | POA: Diagnosis not present

## 2023-10-23 DIAGNOSIS — Z6828 Body mass index (BMI) 28.0-28.9, adult: Secondary | ICD-10-CM | POA: Diagnosis not present

## 2023-10-23 DIAGNOSIS — R079 Chest pain, unspecified: Secondary | ICD-10-CM | POA: Diagnosis not present

## 2023-10-23 DIAGNOSIS — K5900 Constipation, unspecified: Secondary | ICD-10-CM | POA: Diagnosis not present

## 2023-10-23 DIAGNOSIS — Z1211 Encounter for screening for malignant neoplasm of colon: Secondary | ICD-10-CM | POA: Diagnosis not present

## 2023-10-23 DIAGNOSIS — Z713 Dietary counseling and surveillance: Secondary | ICD-10-CM | POA: Diagnosis not present

## 2023-10-23 DIAGNOSIS — E663 Overweight: Secondary | ICD-10-CM | POA: Diagnosis not present

## 2023-10-23 DIAGNOSIS — R1319 Other dysphagia: Secondary | ICD-10-CM | POA: Diagnosis not present

## 2023-10-29 ENCOUNTER — Ambulatory Visit: Payer: BC Managed Care – PPO | Admitting: Internal Medicine

## 2023-10-30 DIAGNOSIS — E663 Overweight: Secondary | ICD-10-CM | POA: Diagnosis not present

## 2023-10-30 DIAGNOSIS — E1059 Type 1 diabetes mellitus with other circulatory complications: Secondary | ICD-10-CM | POA: Diagnosis not present

## 2023-10-30 DIAGNOSIS — I152 Hypertension secondary to endocrine disorders: Secondary | ICD-10-CM | POA: Diagnosis not present

## 2023-10-30 DIAGNOSIS — Z713 Dietary counseling and surveillance: Secondary | ICD-10-CM | POA: Diagnosis not present

## 2023-12-04 ENCOUNTER — Other Ambulatory Visit: Payer: Self-pay | Admitting: Internal Medicine

## 2023-12-04 NOTE — Telephone Encounter (Signed)
Dexcom Sensors  refill request complete,

## 2023-12-18 ENCOUNTER — Ambulatory Visit: Payer: BC Managed Care – PPO | Admitting: Internal Medicine

## 2023-12-18 ENCOUNTER — Telehealth: Payer: Self-pay

## 2023-12-18 ENCOUNTER — Encounter: Payer: Self-pay | Admitting: Internal Medicine

## 2023-12-18 VITALS — BP 124/76 | HR 66 | Ht 70.0 in | Wt 203.0 lb

## 2023-12-18 DIAGNOSIS — E109 Type 1 diabetes mellitus without complications: Secondary | ICD-10-CM | POA: Diagnosis not present

## 2023-12-18 DIAGNOSIS — Z6829 Body mass index (BMI) 29.0-29.9, adult: Secondary | ICD-10-CM | POA: Diagnosis not present

## 2023-12-18 DIAGNOSIS — R635 Abnormal weight gain: Secondary | ICD-10-CM

## 2023-12-18 DIAGNOSIS — Z01419 Encounter for gynecological examination (general) (routine) without abnormal findings: Secondary | ICD-10-CM | POA: Diagnosis not present

## 2023-12-18 DIAGNOSIS — Z1231 Encounter for screening mammogram for malignant neoplasm of breast: Secondary | ICD-10-CM | POA: Diagnosis not present

## 2023-12-18 LAB — POCT GLYCOSYLATED HEMOGLOBIN (HGB A1C): Hemoglobin A1C: 6.4 % — AB (ref 4.0–5.6)

## 2023-12-18 NOTE — Patient Instructions (Signed)

## 2023-12-18 NOTE — Telephone Encounter (Signed)
 Can you contact patient on Monday to help with her Tandem. She states she just had it updated but we were unable to download.

## 2023-12-18 NOTE — Progress Notes (Signed)
 Name: Susan Watkins  MRN/ DOB: [id number], 1976/06/09   Age/ Sex: 48 y.o., female    PCP: [name], [name], MD   Reason for Endocrinology Evaluation: Type 1 Diabetes Mellitus     Date of Initial Endocrinology Visit: 10/22/2021    PATIENT IDENTIFIER: Susan Watkins is a 48 y.o. female with a past medical history of T1DM and Dyslipidemia. The patient presented for initial endocrinology clinic visit on 10/22/2021 for consultative assistance with her diabetes management.    HPI: Susan Watkins was    Diagnosed with DM at age 76 Prior Medications tried/Intolerance: Skin reaction to Lyumjev . On the Medtronic Pump . Has been on the medtronic since the age 12, switched to Tandem 02/2022             Hemoglobin A1c has ranged from 6.7%  in 07/2021, peaking at 6.9% in 2019.   Has musculoskeletal issues  Has a 48 yr old boy, had pre-eclampsia   Transferred care from Dr. Beryl    MENOPAUSE: She was diagnosed with early menopause through her Gyn at age 24, was offered HRT but did not want to continue taking them. She was advised to take MVI for bone health 06/2022    SUBJECTIVE:   During the last visit (04/30/2023): A1c 6.5 %  Today (12/18/23): Susan Watkins   is here for a follow up on diabetes management. She checks her blood sugars multiple  times daily. The patient has  had hypoglycemic episodes since the last clinic visit.   She was following up with [facility] at [facility] but has noted weight gain and has canceled her last appointment lichen planus  is stable  Denies nausea or vomiting  She was diagnosed with early menopause by GYN and was started HRT in 07/2023.  The patient has noted vaginal bleed approximately 2 weeks ago Has appointment with Gyn this afternoon   She was evaluated by GI for constipation as well as chest burning 10/2023, Linzess has been working   This patient with type 1 diabetes is treated with Medtronic (insulin  pump). During the visit the pump basal and bolus doses  were reviewed including carb/insulin  rations and supplemental doses. The clinical list was updated. The glucose meter download was reviewed in detail to determine if the current pump settings are providing the best glycemic control without excessive hypoglycemia.  Pump and meter download:  Based on her last visit these were the recommended settings    Pump   Tandem  Settings  CF  Carb ratio Targer BG  Insulin  type   Humalog       Basal rate          0000 1.00u/h  70 11 100   0200 0.900 70 11 100   0430 1.1 70 14 100   0830 0.850 75 14 100   1100 0.900 70 14 100   1315 0.850 80 12 100   1730 0.900 70 10 90   2030 0.900 70 11 90   2200 1.00 70 11 90                Type & Model of Pump: Tandem Insulin  Type: Currently using Humalog  .      CURRENT PUMP STATISTICS: N/a     CONTINUOUS GLUCOSE MONITORING RECORD INTERPRETATION    Dates of Recording: 12/13-12/26/2024  Sensor description:dexcom  Results statistics:   CGM use % of time 43  Average and SD 159/52  Time in range   65 %  % Time  Above 180 28  % Time above 250 6  % Time Below target 1   Glycemic patterns summary: BG is optimal overnight but fluctuate during the day Hyperglycemic episodes postprandial  Hypoglycemic episodes occurred after bolus Overnight periods: optimal      HOME DIABETES REGIMEN: Humalog     Statin: Crestor  caused joint pains  ACE-I/ARB: no Prior Diabetic Education: yes   DIABETIC COMPLICATIONS: Microvascular complications:   Denies: neuropathy, CKD , neuropathy Last eye exam: Completed 01/08/2023  Macrovascular complications:   Denies: CAD, PVD, CVA   PAST HISTORY: Past Medical History:  Past Medical History:  Diagnosis Date   Diabetes mellitus without complication (HCC)    FH: juvenile onset diabetes mellitus    Lumbar scoliosis    Thoracic scoliosis    Past Surgical History:  Past Surgical History:  Procedure Laterality Date   CERVICAL DISC SURGERY      CESAREAN SECTION      Social History:  reports that she has never smoked. She has never used smokeless tobacco. She reports current alcohol use. She reports that she does not use drugs. Family History:  Family History  Problem Relation Age of Onset   Diabetes Neg Hx      HOME MEDICATIONS: Allergies as of 12/18/2023       Reactions   Levofloxacin Other (See Comments)   Severe Tendonitis    Sulfa Antibiotics Rash        Medication List        Accurate as of December 18, 2023  1:43 PM. If you have any questions, ask your nurse or doctor.          ciprofloxacin 500 MG tablet Commonly known as: CIPRO Take 1 tablet by mouth 2 (two) times daily.   clobetasol cream 0.05 % Commonly known as: TEMOVATE APPLY TO THE AFFECTED AREA TWICE DAILY FOR 2-3 WEEKS   Dexcom G7 Sensor Misc USE 1 SENSOR AS DIRECTED   estradiol 2 MG tablet Commonly known as: ESTRACE Take 2 mg by mouth daily.   glucagon 1 MG injection Glucagon Emergency Kit (human-recomb) 1 mg solution for injection   insulin  glargine 100 unit/mL Sopn Commonly known as: LANTUS  Inject 25 Units into the skin daily.   insulin  glargine 100 UNIT/ML injection Commonly known as: LANTUS  Inject 0.25 mLs (25 Units total) into the skin daily.   insulin  lispro 100 UNIT/ML injection Commonly known as: HUMALOG  INJECT 90 UNITS DAILY IN PUMP   Insulin  Pen Needle 32G X 4 MM Misc 1 Device by Does not apply route daily in the afternoon.   Linzess 145 MCG Caps capsule Generic drug: linaclotide Take 145 mcg by mouth daily before breakfast.   lisinopril 10 MG tablet Commonly known as: ZESTRIL Take 20 mg by mouth daily.   Na Sulfate-K Sulfate-Mg Sulf 17.5-3.13-1.6 GM/177ML Soln TAKE 177 MLS BY MOUTH 2 TIMES   phentermine 37.5 MG capsule   progesterone 100 MG capsule Commonly known as: PROMETRIUM Take 100 mg by mouth daily.   tacrolimus 0.1 % ointment Commonly known as: PROTOPIC Apply to affected area daily    triamcinolone  0.1 % paste Commonly known as: KENALOG  2 (two) times daily.   zolpidem 10 MG tablet Commonly known as: AMBIEN         ALLERGIES: Allergies  Allergen Reactions   Levofloxacin Other (See Comments)    Severe Tendonitis     Sulfa Antibiotics Rash       OBJECTIVE:   VITAL SIGNS: BP 124/76 (BP Location: Left Arm,  Patient Position: Sitting, Cuff Size: Normal)   Pulse 66   Ht 5' 10 (1.778 m)   Wt 203 lb (92.1 kg)   LMP  (LMP Unknown)   SpO2 98%   BMI 29.13 kg/m    PHYSICAL EXAM:  General: Pt appears well and is in NAD  Lungs: Clear with good BS bilat   Heart: RRR   Abdomen: soft, nontender  Extremities:  Lower extremities - No pretibial edema.   Neuro: MS is good with appropriate affect, pt is alert and Ox3    DM foot exam: 12/18/2023  The skin of the feet is intact without sores or ulcerations. The pedal pulses are 2+ on right and 2+ on left. The sensation is intact to a screening 5.07, 10 gram monofilament bilaterally    DATA REVIEWED:  Lab Results  Component Value Date   HGBA1C 6.4 (A) 12/18/2023   HGBA1C 6.5 (A) 04/30/2023   HGBA1C 6.5 (A) 11/13/2022     Latest Reference Range & Units 12/18/23 11:13  Sodium 135 - 146 mmol/L 140  Potassium 3.5 - 5.3 mmol/L 4.4  Chloride 98 - 110 mmol/L 104  CO2 20 - 32 mmol/L 26  Glucose 65 - 99 mg/dL 876 (H)  BUN 7 - 25 mg/dL 19  Creatinine 9.49 - 9.00 mg/dL 9.28  Calcium  8.6 - 10.2 mg/dL 9.7  BUN/Creatinine Ratio 6 - 22 (calc) SEE NOTE:  Total CHOL/HDL Ratio <5.0 (calc) 2.1  Cholesterol <200 mg/dL 797 (H)  HDL Cholesterol > OR = 50 mg/dL 97  LDL Cholesterol (Calc) mg/dL (calc) 88  MICROALB/CREAT RATIO <30 mg/g creat NOTE  Non-HDL Cholesterol (Calc) <130 mg/dL (calc) 894  Triglycerides <150 mg/dL 80     Latest Reference Range & Units 12/18/23 11:13  TSH mIU/L 1.18  T4,Free(Direct) 0.8 - 1.8 ng/dL 1.1    Latest Reference Range & Units 12/18/23 11:13  Microalb, Ur mg/dL <9.7   MICROALB/CREAT RATIO <30 mg/g creat NOTE  Creatinine, Urine 20 - 275 mg/dL 20     ASSESSMENT / PLAN / RECOMMENDATIONS:   1) Type 1 Diabetes Mellitus, Optimally controlled, Without complications - Most recent A1c of  6.4 %. Goal A1c < 7.0 %.    -A1c remains at goal -Unable to download the pump, but I was able to review CGM data, no hypoglycemia overnight, rare hypoglycemia after bolus most likely due to insulin -carbohydrate mismatch -No changes  MEDICATIONS: Humalog       EDUCATION / INSTRUCTIONS: BG monitoring instructions: Patient is instructed to check her blood sugars 3 times a day, before meals . Call [facility] if: BG persistently < 70  I reviewed the Rule of 15 for the treatment of hypoglycemia in detail with the patient. Literature supplied.   2) Diabetic complications:  Eye: Does not have known diabetic retinopathy.  Neuro/ Feet: Does not have known diabetic peripheral neuropathy. Renal: Patient does not have known baseline CKD. She is  on an ACEI/ARB at present.  3) Weight gain:  -She was following up with [facility] at [facility] but had canceled her last appointment due to continued weight gain with the recommendation -She is on HRT through GYN, we discussed weight gain and bloating especially with progesterone -TFTs are normal  F/U in 6 months    Signed electronically by: Stefano Redgie Butts, MD  [facility]  [facility] [address] [place] [place], [place] [address] Phone: [phone] FAX: [phone]   CC: Bobbette Coye LABOR, MD [phone] [address] [phone]  [place] [address] Phone: [phone]  Fax: [phone]    Return to Endocrinology clinic as below: Future Appointments  Date Time Provider Department Center  06/28/2024  7:30 AM Najae Rathert, Donell Cardinal, MD [facility] None

## 2023-12-19 ENCOUNTER — Encounter: Payer: Self-pay | Admitting: Internal Medicine

## 2023-12-19 LAB — BASIC METABOLIC PANEL
BUN: 19 mg/dL (ref 7–25)
CO2: 26 mmol/L (ref 20–32)
Calcium: 9.7 mg/dL (ref 8.6–10.2)
Chloride: 104 mmol/L (ref 98–110)
Creat: 0.71 mg/dL (ref 0.50–0.99)
Glucose, Bld: 123 mg/dL — ABNORMAL HIGH (ref 65–99)
Potassium: 4.4 mmol/L (ref 3.5–5.3)
Sodium: 140 mmol/L (ref 135–146)

## 2023-12-19 LAB — LIPID PANEL
Cholesterol: 202 mg/dL — ABNORMAL HIGH (ref <200)
HDL: 97 mg/dL (ref >=50)
LDL Cholesterol (Calc): 88 mg/dL
Non-HDL Cholesterol (Calc): 105 mg/dL (ref <130)
Total CHOL/HDL Ratio: 2.1 (calc) (ref <5.0)
Triglycerides: 80 mg/dL (ref <150)

## 2023-12-19 LAB — TSH: TSH: 1.18 m[IU]/L

## 2023-12-19 LAB — MICROALBUMIN / CREATININE URINE RATIO
Creatinine, Urine: 20 mg/dL (ref 20–275)
Microalb, Ur: <0.2 mg/dL

## 2023-12-19 LAB — T4, FREE: Free T4: 1.1 ng/dL (ref 0.8–1.8)

## 2023-12-23 NOTE — Telephone Encounter (Signed)
 Patient was told that any time she upgrades her Tandem pump, she will need to unpair it from Tandem Source app on her phone and repair this.  Directions for where to go on her pump were given to her and my number to call if questions and to let me know when she completes this.

## 2023-12-26 ENCOUNTER — Other Ambulatory Visit: Payer: Self-pay | Admitting: Internal Medicine

## 2024-01-08 ENCOUNTER — Other Ambulatory Visit: Payer: Self-pay

## 2024-01-12 DIAGNOSIS — R1319 Other dysphagia: Secondary | ICD-10-CM | POA: Diagnosis not present

## 2024-01-12 DIAGNOSIS — R079 Chest pain, unspecified: Secondary | ICD-10-CM | POA: Diagnosis not present

## 2024-01-12 DIAGNOSIS — Z1211 Encounter for screening for malignant neoplasm of colon: Secondary | ICD-10-CM | POA: Diagnosis not present

## 2024-01-12 DIAGNOSIS — K2289 Other specified disease of esophagus: Secondary | ICD-10-CM | POA: Diagnosis not present

## 2024-01-12 DIAGNOSIS — K2100 Gastro-esophageal reflux disease with esophagitis, without bleeding: Secondary | ICD-10-CM | POA: Diagnosis not present

## 2024-01-12 DIAGNOSIS — R1310 Dysphagia, unspecified: Secondary | ICD-10-CM | POA: Diagnosis not present

## 2024-02-06 DIAGNOSIS — H43823 Vitreomacular adhesion, bilateral: Secondary | ICD-10-CM | POA: Diagnosis not present

## 2024-02-06 DIAGNOSIS — E109 Type 1 diabetes mellitus without complications: Secondary | ICD-10-CM | POA: Diagnosis not present

## 2024-02-06 DIAGNOSIS — H2513 Age-related nuclear cataract, bilateral: Secondary | ICD-10-CM | POA: Diagnosis not present

## 2024-03-04 DIAGNOSIS — E109 Type 1 diabetes mellitus without complications: Secondary | ICD-10-CM | POA: Diagnosis not present

## 2024-04-01 DIAGNOSIS — N951 Menopausal and female climacteric states: Secondary | ICD-10-CM | POA: Diagnosis not present

## 2024-04-01 DIAGNOSIS — E2839 Other primary ovarian failure: Secondary | ICD-10-CM | POA: Diagnosis not present

## 2024-04-03 ENCOUNTER — Other Ambulatory Visit: Payer: Self-pay | Admitting: Internal Medicine

## 2024-04-19 DIAGNOSIS — H1013 Acute atopic conjunctivitis, bilateral: Secondary | ICD-10-CM | POA: Diagnosis not present

## 2024-04-19 DIAGNOSIS — H1033 Unspecified acute conjunctivitis, bilateral: Secondary | ICD-10-CM | POA: Diagnosis not present

## 2024-04-20 ENCOUNTER — Encounter: Payer: Self-pay | Admitting: Internal Medicine

## 2024-05-08 DIAGNOSIS — J019 Acute sinusitis, unspecified: Secondary | ICD-10-CM | POA: Diagnosis not present

## 2024-06-21 ENCOUNTER — Encounter: Payer: Self-pay | Admitting: Internal Medicine

## 2024-06-28 ENCOUNTER — Ambulatory Visit: Payer: BC Managed Care – PPO | Admitting: Internal Medicine

## 2024-06-28 DIAGNOSIS — E109 Type 1 diabetes mellitus without complications: Secondary | ICD-10-CM | POA: Diagnosis not present

## 2024-06-28 DIAGNOSIS — N840 Polyp of corpus uteri: Secondary | ICD-10-CM | POA: Diagnosis not present

## 2024-06-28 DIAGNOSIS — N939 Abnormal uterine and vaginal bleeding, unspecified: Secondary | ICD-10-CM | POA: Diagnosis not present

## 2024-06-28 DIAGNOSIS — E2839 Other primary ovarian failure: Secondary | ICD-10-CM | POA: Diagnosis not present

## 2024-07-13 ENCOUNTER — Encounter: Payer: Self-pay | Admitting: Internal Medicine

## 2024-07-13 ENCOUNTER — Ambulatory Visit: Admitting: Internal Medicine

## 2024-07-13 VITALS — BP 116/72 | HR 79 | Ht 70.0 in | Wt 195.0 lb

## 2024-07-13 DIAGNOSIS — E109 Type 1 diabetes mellitus without complications: Secondary | ICD-10-CM

## 2024-07-13 LAB — POCT GLYCOSYLATED HEMOGLOBIN (HGB A1C): Hemoglobin A1C: 6.6 % — AB (ref 4.0–5.6)

## 2024-07-13 MED ORDER — INSULIN LISPRO 100 UNIT/ML IJ SOLN: INTRAMUSCULAR | 3 refills | Status: DC

## 2024-07-13 NOTE — Progress Notes (Signed)
 Name: Susan Watkins  MRN/ DOB: [id number], Aug 23, 1976   Age/ Sex: 48 y.o., female    PCP: [name], [name], MD   Reason for Endocrinology Evaluation: Type 1 Diabetes Mellitus     Date of Initial Endocrinology Visit: 10/22/2021    PATIENT IDENTIFIER: Susan Watkins is a 48 y.o. female with a past medical history of T1DM and Dyslipidemia. The patient presented for initial endocrinology clinic visit on 10/22/2021 for consultative assistance with her diabetes management.    HPI: [name] was    Diagnosed with DM at age 50 Prior Medications tried/Intolerance: Skin reaction to Lyumjev . On the Medtronic Pump . Has been on the medtronic since the age 63, switched to Tandem 02/2022             Hemoglobin A1c has ranged from 6.7%  in 07/2021, peaking at 6.9% in 2019.   Has musculoskeletal issues  Has a 48 yr old boy, had pre-eclampsia   Transferred care from Dr. Beryl    MENOPAUSE: She was diagnosed with early menopause through her Gyn at age 60, was offered HRT but did not want to continue taking them. She was advised to take MVI for bone health 06/2022    SUBJECTIVE:   During the last visit (12/18/2023): A1c 6.5 %  Today (07/13/24): [name]   is here for a follow up on diabetes management. She checks her blood sugars multiple  times daily, The patient has  had hypoglycemic episodes since the last clinic visit, but she also endorses skewed readings due to faulty sensor.    Denies nausea or vomiting  Denies constipation or diarrhea , not on linzess    This patient with type 1 diabetes is treated with Medtronic (insulin  pump). During the visit the pump basal and bolus doses were reviewed including carb/insulin  rations and supplemental doses. The clinical list was updated. The glucose meter download was reviewed in detail to determine if the current pump settings are providing the best glycemic control without excessive hypoglycemia.  Pump and meter download:   Pump   Tandem   Settings  CF  Carb ratio Targer BG  Insulin  type   Humalog       Basal rate          0000 1.100 u/h  80 11 100   0200 1.000 80 11 100   0430 1.300 60 8 100   0830 1.300 80 10 100   1100 1.000 75 11 100   1315 0.800 80 15 100   1730 0.900 70 10 90   2030 1.100 75 10 90                Type & Model of Pump: Tandem Insulin  Type: Currently using Humalog  .      CURRENT PUMP STATISTICS: Average BG: 155  Average Daily Carbs (g): 167 Average Total Daily Insulin : 47.81 Average Daily Basal: 24.9 (52 %) Average Daily Bolus: 22.9 (48 %)    CONTINUOUS GLUCOSE MONITORING RECORD INTERPRETATION    Dates of Recording: 7/23-07/13/2024  Sensor description:dexcom  Results statistics:   CGM use % of time 97  Average and SD 155/57  Time in range  72 %  % Time Above 180 19  % Time above 250 7.9  % Time Below target 0.4   Glycemic patterns summary: BG is optimal overnight and fluctuate during the day  Hypoglycemic episodes occurred variable Overnight periods: optimal      HOME DIABETES REGIMEN: Humalog     Statin: Crestor   caused joint pains  ACE-I/ARB: no Prior Diabetic Education: yes   DIABETIC COMPLICATIONS: Microvascular complications:   Denies: neuropathy, CKD , neuropathy Last eye exam: Completed 01/08/2023  Macrovascular complications:   Denies: CAD, PVD, CVA   PAST HISTORY: Past Medical History:  Past Medical History:  Diagnosis Date   Diabetes mellitus without complication (HCC)    FH: juvenile onset diabetes mellitus    Lumbar scoliosis    Thoracic scoliosis    Past Surgical History:  Past Surgical History:  Procedure Laterality Date   CERVICAL DISC SURGERY     CESAREAN SECTION      Social History:  reports that she has never smoked. She has never used smokeless tobacco. She reports current alcohol use. She reports that she does not use drugs. Family History:  Family History  Problem Relation Age of Onset   Diabetes Neg Hx      HOME  MEDICATIONS: Allergies as of 07/13/2024       Reactions   Levofloxacin Other (See Comments)   Severe Tendonitis    Sulfa Antibiotics Rash        Medication List        Accurate as of July 13, 2024  9:52 AM. If you have any questions, ask your nurse or doctor.          ciprofloxacin 500 MG tablet Commonly known as: CIPRO Take 1 tablet by mouth 2 (two) times daily.   clobetasol cream 0.05 % Commonly known as: TEMOVATE APPLY TO THE AFFECTED AREA TWICE DAILY FOR 2-3 WEEKS   Dexcom G7 Sensor Misc USE 1 SENSOR AS DIRECTED   estradiol 2 MG tablet Commonly known as: ESTRACE Take 2 mg by mouth daily.   glucagon 1 MG injection Glucagon Emergency Kit (human-recomb) 1 mg solution for injection   insulin  glargine 100 unit/mL Sopn Commonly known as: LANTUS  Inject 25 Units into the skin daily.   Lantus  100 UNIT/ML injection Generic drug: insulin  glargine ADMINISTER 25 UNITS UNDER THE SKIN DAILY   insulin  lispro 100 UNIT/ML injection Commonly known as: HumaLOG  INJECT MAXIMUM DAILY 90 UNITS PER PUMP   Insulin  Pen Needle 32G X 4 MM Misc 1 Device by Does not apply route daily in the afternoon.   Linzess 145 MCG Caps capsule Generic drug: linaclotide Take 145 mcg by mouth daily before breakfast.   lisinopril 10 MG tablet Commonly known as: ZESTRIL Take 20 mg by mouth daily.   Na Sulfate-K Sulfate-Mg Sulfate concentrate 17.5-3.13-1.6 GM/177ML Soln Commonly known as: SUPREP TAKE 177 MLS BY MOUTH 2 TIMES   phentermine 37.5 MG capsule   progesterone 100 MG capsule Commonly known as: PROMETRIUM Take 100 mg by mouth daily.   tacrolimus 0.1 % ointment Commonly known as: PROTOPIC Apply to affected area daily   triamcinolone  0.1 % paste Commonly known as: KENALOG  2 (two) times daily.   zolpidem 10 MG tablet Commonly known as: AMBIEN         ALLERGIES: Allergies  Allergen Reactions   Levofloxacin Other (See Comments)    Severe Tendonitis     Sulfa  Antibiotics Rash       OBJECTIVE:   VITAL SIGNS: BP 116/72 (BP Location: Left Arm, Patient Position: Sitting, Cuff Size: Normal)   Pulse 79   Ht 5' 10 (1.778 m)   Wt 195 lb (88.5 kg)   LMP  (LMP Unknown)   SpO2 98%   BMI 27.98 kg/m    PHYSICAL EXAM:  General: Pt appears well and is in NAD  Lungs: Clear with good BS bilat   Heart: RRR   Abdomen: soft, nontender  Extremities:  Lower extremities - No pretibial edema.   Neuro: MS is good with appropriate affect, pt is alert and Ox3    DM foot exam: 07/13/2024  The skin of the feet is intact without sores or ulcerations. The pedal pulses are 2+ on right and 2+ on left. The sensation is intact to a screening 5.07, 10 gram monofilament bilaterally    DATA REVIEWED:  Lab Results  Component Value Date   HGBA1C 6.6 (A) 07/13/2024   HGBA1C 6.4 (A) 12/18/2023   HGBA1C 6.5 (A) 04/30/2023     Latest Reference Range & Units 12/18/23 11:13  Sodium 135 - 146 mmol/L 140  Potassium 3.5 - 5.3 mmol/L 4.4  Chloride 98 - 110 mmol/L 104  CO2 20 - 32 mmol/L 26  Glucose 65 - 99 mg/dL 876 (H)  BUN 7 - 25 mg/dL 19  Creatinine 9.49 - 9.00 mg/dL 9.28  Calcium  8.6 - 10.2 mg/dL 9.7  BUN/Creatinine Ratio 6 - 22 (calc) SEE NOTE:  Total CHOL/HDL Ratio <5.0 (calc) 2.1  Cholesterol <200 mg/dL 797 (H)  HDL Cholesterol > OR = 50 mg/dL 97  LDL Cholesterol (Calc) mg/dL (calc) 88  MICROALB/CREAT RATIO <30 mg/g creat NOTE  Non-HDL Cholesterol (Calc) <130 mg/dL (calc) 894  Triglycerides <150 mg/dL 80     Latest Reference Range & Units 12/18/23 11:13  TSH mIU/L 1.18  T4,Free(Direct) 0.8 - 1.8 ng/dL 1.1    Latest Reference Range & Units 12/18/23 11:13  Microalb, Ur mg/dL <9.7  MICROALB/CREAT RATIO <30 mg/g creat NOTE  Creatinine, Urine 20 - 275 mg/dL 20     ASSESSMENT / PLAN / RECOMMENDATIONS:   1) Type 1 Diabetes Mellitus, Optimally controlled, Without complications - Most recent A1c of  6.6 %. Goal A1c < 7.0 %.    -A1c remains at  goal -No changes  MEDICATIONS: Humalog      EDUCATION / INSTRUCTIONS: BG monitoring instructions: Patient is instructed to check her blood sugars 3 times a day, before meals . Call [facility] if: BG persistently < 70  I reviewed the Rule of 15 for the treatment of hypoglycemia in detail with the patient. Literature supplied.   2) Diabetic complications:  Eye: Does not have known diabetic retinopathy.  Neuro/ Feet: Does not have known diabetic peripheral neuropathy. Renal: Patient does not have known baseline CKD. She is  on an ACEI/ARB at present.   F/U in 6 months    Signed electronically by: Stefano Redgie Butts, MD  [facility]  [facility] [address] [place], [place] [address] Phone: [phone] FAX: [phone]   CC: Bobbette Coye LABOR, MD [address] [place] [address] Phone: [phone]  Fax: [phone]    Return to Endocrinology clinic as below: Future Appointments  Date Time Provider Department Center  12/20/2024 10:10 AM Rachael Zapanta, Donell Redgie, MD [facility] None

## 2024-07-26 DIAGNOSIS — I152 Hypertension secondary to endocrine disorders: Secondary | ICD-10-CM | POA: Diagnosis not present

## 2024-07-26 DIAGNOSIS — F411 Generalized anxiety disorder: Secondary | ICD-10-CM | POA: Diagnosis not present

## 2024-07-26 DIAGNOSIS — E1059 Type 1 diabetes mellitus with other circulatory complications: Secondary | ICD-10-CM | POA: Diagnosis not present

## 2024-07-26 DIAGNOSIS — Z1330 Encounter for screening examination for mental health and behavioral disorders, unspecified: Secondary | ICD-10-CM | POA: Diagnosis not present

## 2024-07-26 DIAGNOSIS — E1049 Type 1 diabetes mellitus with other diabetic neurological complication: Secondary | ICD-10-CM | POA: Diagnosis not present

## 2024-07-26 DIAGNOSIS — Z0000 Encounter for general adult medical examination without abnormal findings: Secondary | ICD-10-CM | POA: Diagnosis not present

## 2024-07-26 DIAGNOSIS — M62838 Other muscle spasm: Secondary | ICD-10-CM | POA: Diagnosis not present

## 2024-09-07 DIAGNOSIS — Z3202 Encounter for pregnancy test, result negative: Secondary | ICD-10-CM | POA: Diagnosis not present

## 2024-09-07 DIAGNOSIS — N840 Polyp of corpus uteri: Secondary | ICD-10-CM | POA: Diagnosis not present

## 2024-10-05 DIAGNOSIS — E1059 Type 1 diabetes mellitus with other circulatory complications: Secondary | ICD-10-CM | POA: Diagnosis not present

## 2024-10-05 DIAGNOSIS — E1049 Type 1 diabetes mellitus with other diabetic neurological complication: Secondary | ICD-10-CM | POA: Diagnosis not present

## 2024-10-05 DIAGNOSIS — Z13228 Encounter for screening for other metabolic disorders: Secondary | ICD-10-CM | POA: Diagnosis not present

## 2024-10-05 DIAGNOSIS — R7989 Other specified abnormal findings of blood chemistry: Secondary | ICD-10-CM | POA: Diagnosis not present

## 2024-10-05 DIAGNOSIS — I152 Hypertension secondary to endocrine disorders: Secondary | ICD-10-CM | POA: Diagnosis not present

## 2024-10-17 ENCOUNTER — Encounter: Payer: Self-pay | Admitting: Internal Medicine

## 2024-10-18 MED ORDER — DEXCOM G7 SENSOR MISC: 1.0000 | 3 refills | Status: AC

## 2024-12-08 ENCOUNTER — Other Ambulatory Visit: Payer: Self-pay

## 2024-12-08 MED ORDER — INSULIN LISPRO 100 UNIT/ML IJ SOLN: INTRAMUSCULAR | 3 refills | Status: AC

## 2024-12-20 ENCOUNTER — Ambulatory Visit: Admitting: Internal Medicine

## 2025-01-10 ENCOUNTER — Ambulatory Visit: Admitting: Internal Medicine

## 2025-01-17 ENCOUNTER — Ambulatory Visit: Admitting: Internal Medicine

## 2025-03-29 ENCOUNTER — Ambulatory Visit: Admitting: Internal Medicine
